# Patient Record
Sex: Male | Born: 1950 | Race: White | Hispanic: No | Marital: Married | Smoking: Former smoker
Health system: Southern US, Community
[De-identification: ages and names within clinical notes are randomized; demographics above are authoritative.]

## PROBLEM LIST (undated history)

## (undated) DIAGNOSIS — C801 Malignant (primary) neoplasm, unspecified: Secondary | ICD-10-CM

## (undated) HISTORY — PX: EYE MUSCLE SURGERY: SHX370

## (undated) HISTORY — PX: COLONOSCOPY: SHX174

## (undated) HISTORY — PX: LUNG REMOVAL, PARTIAL: SHX233

---

## 2007-03-14 ENCOUNTER — Ambulatory Visit (HOSPITAL_COMMUNITY): Admission: RE | Admit: 2007-03-14 | Discharge: 2007-03-14 | Payer: Self-pay | Admitting: Ophthalmology

## 2007-03-15 ENCOUNTER — Ambulatory Visit (HOSPITAL_COMMUNITY): Admission: RE | Admit: 2007-03-15 | Discharge: 2007-03-15 | Payer: Self-pay | Admitting: Ophthalmology

## 2007-03-20 ENCOUNTER — Ambulatory Visit: Payer: Self-pay | Admitting: Cardiothoracic Surgery

## 2007-03-25 ENCOUNTER — Ambulatory Visit: Admission: RE | Admit: 2007-03-25 | Discharge: 2007-03-25 | Payer: Self-pay | Admitting: Cardiothoracic Surgery

## 2007-03-25 ENCOUNTER — Ambulatory Visit (HOSPITAL_COMMUNITY): Admission: RE | Admit: 2007-03-25 | Discharge: 2007-03-25 | Payer: Self-pay | Admitting: Cardiothoracic Surgery

## 2007-04-03 ENCOUNTER — Ambulatory Visit: Payer: Self-pay | Admitting: Internal Medicine

## 2007-04-07 ENCOUNTER — Ambulatory Visit: Payer: Self-pay

## 2007-04-09 ENCOUNTER — Ambulatory Visit: Payer: Self-pay | Admitting: Cardiothoracic Surgery

## 2007-04-18 ENCOUNTER — Ambulatory Visit: Payer: Self-pay | Admitting: Cardiothoracic Surgery

## 2007-04-18 ENCOUNTER — Inpatient Hospital Stay (HOSPITAL_COMMUNITY): Admission: RE | Admit: 2007-04-18 | Discharge: 2007-04-27 | Payer: Self-pay | Admitting: Cardiothoracic Surgery

## 2007-04-18 ENCOUNTER — Encounter: Payer: Self-pay | Admitting: Cardiothoracic Surgery

## 2007-04-22 ENCOUNTER — Ambulatory Visit: Payer: Self-pay | Admitting: Internal Medicine

## 2007-04-23 ENCOUNTER — Ambulatory Visit: Payer: Self-pay | Admitting: Internal Medicine

## 2007-05-07 LAB — CBC WITH DIFFERENTIAL/PLATELET
BASO%: 0.7 % (ref 0.0–2.0)
Basophils Absolute: 0 10*3/uL (ref 0.0–0.1)
EOS%: 15.5 % — ABNORMAL HIGH (ref 0.0–7.0)
LYMPH%: 23.1 % (ref 14.0–48.0)
MONO%: 9.3 % (ref 0.0–13.0)
NEUT#: 3.5 10*3/uL (ref 1.5–6.5)
NEUT%: 51.4 % (ref 40.0–75.0)
Platelets: 425 10*3/uL — ABNORMAL HIGH (ref 145–400)
RBC: 4.07 10*6/uL — ABNORMAL LOW (ref 4.20–5.71)
WBC: 6.8 10*3/uL (ref 4.0–10.0)

## 2007-05-07 LAB — COMPREHENSIVE METABOLIC PANEL
ALT: 19 U/L (ref 0–53)
AST: 16 U/L (ref 0–37)
Alkaline Phosphatase: 59 U/L (ref 39–117)
BUN: 16 mg/dL (ref 6–23)
Calcium: 9.1 mg/dL (ref 8.4–10.5)
Creatinine, Ser: 1.04 mg/dL (ref 0.40–1.50)
Total Bilirubin: 0.3 mg/dL (ref 0.3–1.2)

## 2007-05-08 ENCOUNTER — Encounter: Admission: RE | Admit: 2007-05-08 | Discharge: 2007-05-08 | Payer: Self-pay | Admitting: Cardiothoracic Surgery

## 2007-05-08 ENCOUNTER — Ambulatory Visit: Payer: Self-pay | Admitting: Cardiothoracic Surgery

## 2007-05-21 LAB — CBC WITH DIFFERENTIAL/PLATELET
Basophils Absolute: 0 10*3/uL (ref 0.0–0.1)
EOS%: 8.5 % — ABNORMAL HIGH (ref 0.0–7.0)
HCT: 39.6 % (ref 38.7–49.9)
HGB: 14 g/dL (ref 13.0–17.1)
LYMPH%: 38.5 % (ref 14.0–48.0)
MCH: 29.9 pg (ref 28.0–33.4)
MCV: 84.4 fL (ref 81.6–98.0)
NEUT%: 42.9 % (ref 40.0–75.0)
Platelets: 273 10*3/uL (ref 145–400)
lymph#: 2.1 10*3/uL (ref 0.9–3.3)

## 2007-05-21 LAB — MAGNESIUM: Magnesium: 2.1 mg/dL (ref 1.5–2.5)

## 2007-05-21 LAB — COMPREHENSIVE METABOLIC PANEL
AST: 19 U/L (ref 0–37)
BUN: 15 mg/dL (ref 6–23)
Calcium: 9.4 mg/dL (ref 8.4–10.5)
Chloride: 104 mEq/L (ref 96–112)
Creatinine, Ser: 1.11 mg/dL (ref 0.40–1.50)
Total Bilirubin: 0.6 mg/dL (ref 0.3–1.2)

## 2007-05-29 LAB — COMPREHENSIVE METABOLIC PANEL
Albumin: 4.1 g/dL (ref 3.5–5.2)
BUN: 13 mg/dL (ref 6–23)
CO2: 26 mEq/L (ref 19–32)
Calcium: 8.5 mg/dL (ref 8.4–10.5)
Chloride: 100 mEq/L (ref 96–112)
Glucose, Bld: 127 mg/dL — ABNORMAL HIGH (ref 70–99)
Potassium: 4.3 mEq/L (ref 3.5–5.3)

## 2007-05-29 LAB — CBC WITH DIFFERENTIAL/PLATELET
Basophils Absolute: 0 10*3/uL (ref 0.0–0.1)
Eosinophils Absolute: 0.1 10*3/uL (ref 0.0–0.5)
HCT: 36.7 % — ABNORMAL LOW (ref 38.7–49.9)
HGB: 13.1 g/dL (ref 13.0–17.1)
MCV: 84.2 fL (ref 81.6–98.0)
NEUT#: 7 10*3/uL — ABNORMAL HIGH (ref 1.5–6.5)
RDW: 13.2 % (ref 11.2–14.6)
lymph#: 2.3 10*3/uL (ref 0.9–3.3)

## 2007-05-29 LAB — MAGNESIUM: Magnesium: 2 mg/dL (ref 1.5–2.5)

## 2007-06-03 ENCOUNTER — Ambulatory Visit: Payer: Self-pay | Admitting: Internal Medicine

## 2007-06-11 LAB — COMPREHENSIVE METABOLIC PANEL
BUN: 18 mg/dL (ref 6–23)
CO2: 25 mEq/L (ref 19–32)
Calcium: 8.9 mg/dL (ref 8.4–10.5)
Chloride: 104 mEq/L (ref 96–112)
Creatinine, Ser: 1.02 mg/dL (ref 0.40–1.50)
Total Bilirubin: 0.4 mg/dL (ref 0.3–1.2)

## 2007-06-11 LAB — CBC WITH DIFFERENTIAL/PLATELET
Basophils Absolute: 0 10*3/uL (ref 0.0–0.1)
EOS%: 0.4 % (ref 0.0–7.0)
HCT: 36.1 % — ABNORMAL LOW (ref 38.7–49.9)
HGB: 12.8 g/dL — ABNORMAL LOW (ref 13.0–17.1)
LYMPH%: 24.5 % (ref 14.0–48.0)
MCH: 29.9 pg (ref 28.0–33.4)
MCHC: 35.4 g/dL (ref 32.0–35.9)
MONO#: 0.3 10*3/uL (ref 0.1–0.9)
NEUT%: 69.7 % (ref 40.0–75.0)
Platelets: 475 10*3/uL — ABNORMAL HIGH (ref 145–400)
lymph#: 1.6 10*3/uL (ref 0.9–3.3)

## 2007-06-11 LAB — MAGNESIUM: Magnesium: 2.1 mg/dL (ref 1.5–2.5)

## 2007-07-21 ENCOUNTER — Ambulatory Visit: Payer: Self-pay | Admitting: Internal Medicine

## 2007-07-23 LAB — COMPREHENSIVE METABOLIC PANEL
Alkaline Phosphatase: 64 U/L (ref 39–117)
BUN: 14 mg/dL (ref 6–23)
Creatinine, Ser: 1.04 mg/dL (ref 0.40–1.50)
Glucose, Bld: 90 mg/dL (ref 70–99)
Total Bilirubin: 0.4 mg/dL (ref 0.3–1.2)

## 2007-07-23 LAB — CBC WITH DIFFERENTIAL/PLATELET
Basophils Absolute: 0 10*3/uL (ref 0.0–0.1)
Eosinophils Absolute: 0 10*3/uL (ref 0.0–0.5)
HCT: 35 % — ABNORMAL LOW (ref 38.7–49.9)
HGB: 12.5 g/dL — ABNORMAL LOW (ref 13.0–17.1)
MONO#: 0.6 10*3/uL (ref 0.1–0.9)
NEUT#: 2.9 10*3/uL (ref 1.5–6.5)
RDW: 17 % — ABNORMAL HIGH (ref 11.2–14.6)
lymph#: 1.7 10*3/uL (ref 0.9–3.3)

## 2007-08-22 ENCOUNTER — Ambulatory Visit (HOSPITAL_COMMUNITY): Admission: RE | Admit: 2007-08-22 | Discharge: 2007-08-22 | Payer: Self-pay | Admitting: Internal Medicine

## 2007-08-25 LAB — CBC WITH DIFFERENTIAL/PLATELET
Eosinophils Absolute: 0.1 10*3/uL (ref 0.0–0.5)
HCT: 40.1 % (ref 38.7–49.9)
LYMPH%: 37.1 % (ref 14.0–48.0)
MONO#: 0.5 10*3/uL (ref 0.1–0.9)
NEUT#: 2.9 10*3/uL (ref 1.5–6.5)
NEUT%: 51.1 % (ref 40.0–75.0)
Platelets: 250 10*3/uL (ref 145–400)
WBC: 5.6 10*3/uL (ref 4.0–10.0)

## 2007-08-25 LAB — COMPREHENSIVE METABOLIC PANEL
CO2: 25 mEq/L (ref 19–32)
Calcium: 9.4 mg/dL (ref 8.4–10.5)
Chloride: 104 mEq/L (ref 96–112)
Creatinine, Ser: 1.27 mg/dL (ref 0.40–1.50)
Glucose, Bld: 146 mg/dL — ABNORMAL HIGH (ref 70–99)
Sodium: 139 mEq/L (ref 135–145)
Total Bilirubin: 0.5 mg/dL (ref 0.3–1.2)
Total Protein: 6.9 g/dL (ref 6.0–8.3)

## 2007-08-25 LAB — MAGNESIUM: Magnesium: 2.1 mg/dL (ref 1.5–2.5)

## 2007-09-12 ENCOUNTER — Ambulatory Visit (HOSPITAL_COMMUNITY): Admission: RE | Admit: 2007-09-12 | Discharge: 2007-09-12 | Payer: Self-pay | Admitting: Ophthalmology

## 2007-10-16 ENCOUNTER — Ambulatory Visit: Payer: Self-pay | Admitting: Cardiothoracic Surgery

## 2007-10-16 ENCOUNTER — Encounter: Admission: RE | Admit: 2007-10-16 | Discharge: 2007-10-16 | Payer: Self-pay | Admitting: Cardiothoracic Surgery

## 2007-10-23 ENCOUNTER — Ambulatory Visit: Payer: Self-pay | Admitting: Internal Medicine

## 2007-10-27 LAB — CBC WITH DIFFERENTIAL/PLATELET
BASO%: 0.8 % (ref 0.0–2.0)
EOS%: 3.7 % (ref 0.0–7.0)
HCT: 40.9 % (ref 38.7–49.9)
MCHC: 34.9 g/dL (ref 32.0–35.9)
MONO#: 0.6 10*3/uL (ref 0.1–0.9)
NEUT%: 47.4 % (ref 40.0–75.0)
RDW: 12.4 % (ref 11.2–14.6)
WBC: 5.3 10*3/uL (ref 4.0–10.0)
lymph#: 2 10*3/uL (ref 0.9–3.3)

## 2007-10-27 LAB — COMPREHENSIVE METABOLIC PANEL
ALT: 13 U/L (ref 0–53)
AST: 21 U/L (ref 0–37)
Calcium: 9.7 mg/dL (ref 8.4–10.5)
Chloride: 104 mEq/L (ref 96–112)
Creatinine, Ser: 1.2 mg/dL (ref 0.40–1.50)
Potassium: 4.5 mEq/L (ref 3.5–5.3)
Sodium: 140 mEq/L (ref 135–145)

## 2007-11-05 ENCOUNTER — Ambulatory Visit (HOSPITAL_COMMUNITY): Admission: RE | Admit: 2007-11-05 | Discharge: 2007-11-05 | Payer: Self-pay | Admitting: Internal Medicine

## 2008-03-17 IMAGING — CR DG CHEST 1V PORT
1 series · 1 of 1 positions shown · non-contrast
Comparison: Comparison is made with yesterday?s exam.

CLINICAL DATA: Follow-up VATS.
 PORTABLE CHEST - 1 VIEW ? 04/25/07 AT 6288 HOURS:

[view not recorded]
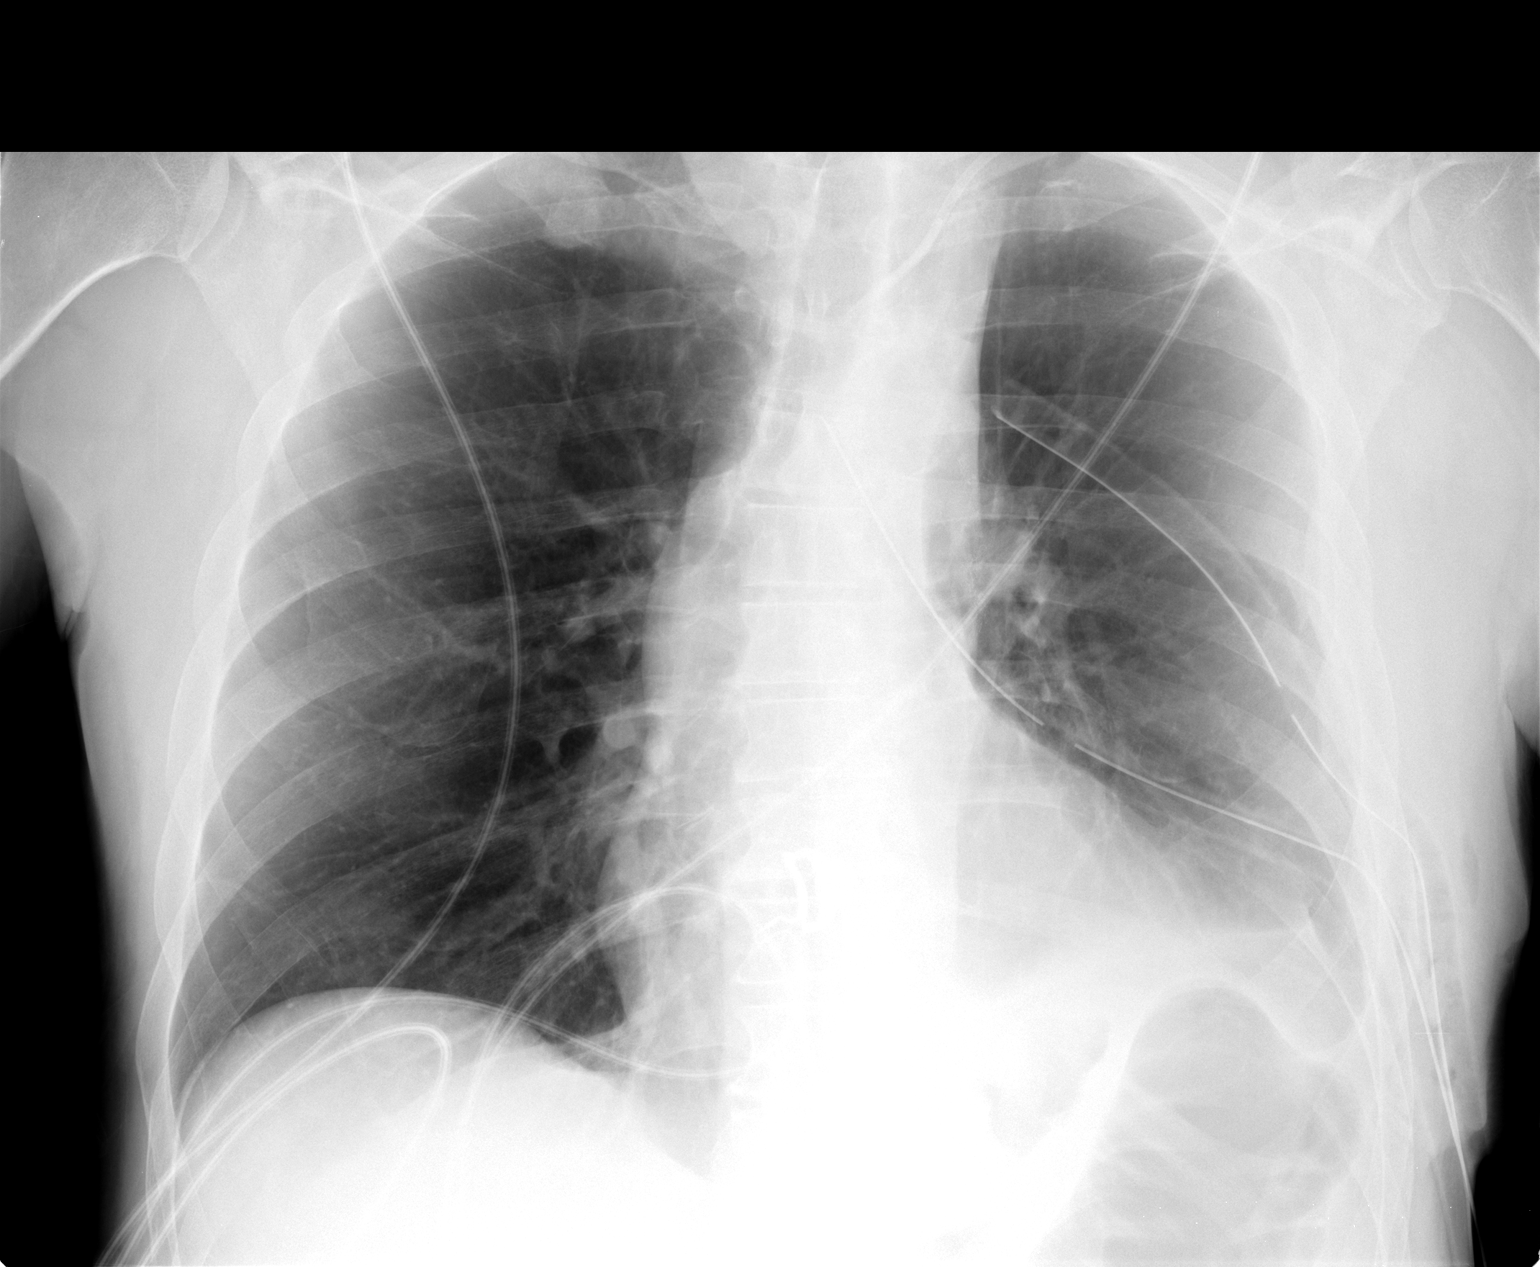

[1 of 1 positions shown; findings below may reference images not displayed]

FINDINGS: Left subclavian CVC is in the distal aspect of the left innominate vein. Two left pleural chest tubes. No pneumothorax. Mild pleural thickening/fluid lateral aspect of left lower hemithorax in the lateral costophrenic angle.
IMPRESSION: No significant interval change. Postop findings on the left. No pneumothorax.

## 2008-03-18 IMAGING — CR DG CHEST 1V PORT
1 series · 1 of 1 positions shown · non-contrast
Comparison: 04/25/07 and 04/24/07.

CLINICAL DATA: 56-year-old male with lung abscess. Status post thoracotomy.
PORTABLE CHEST ? 1 VIEW:

[view not recorded]
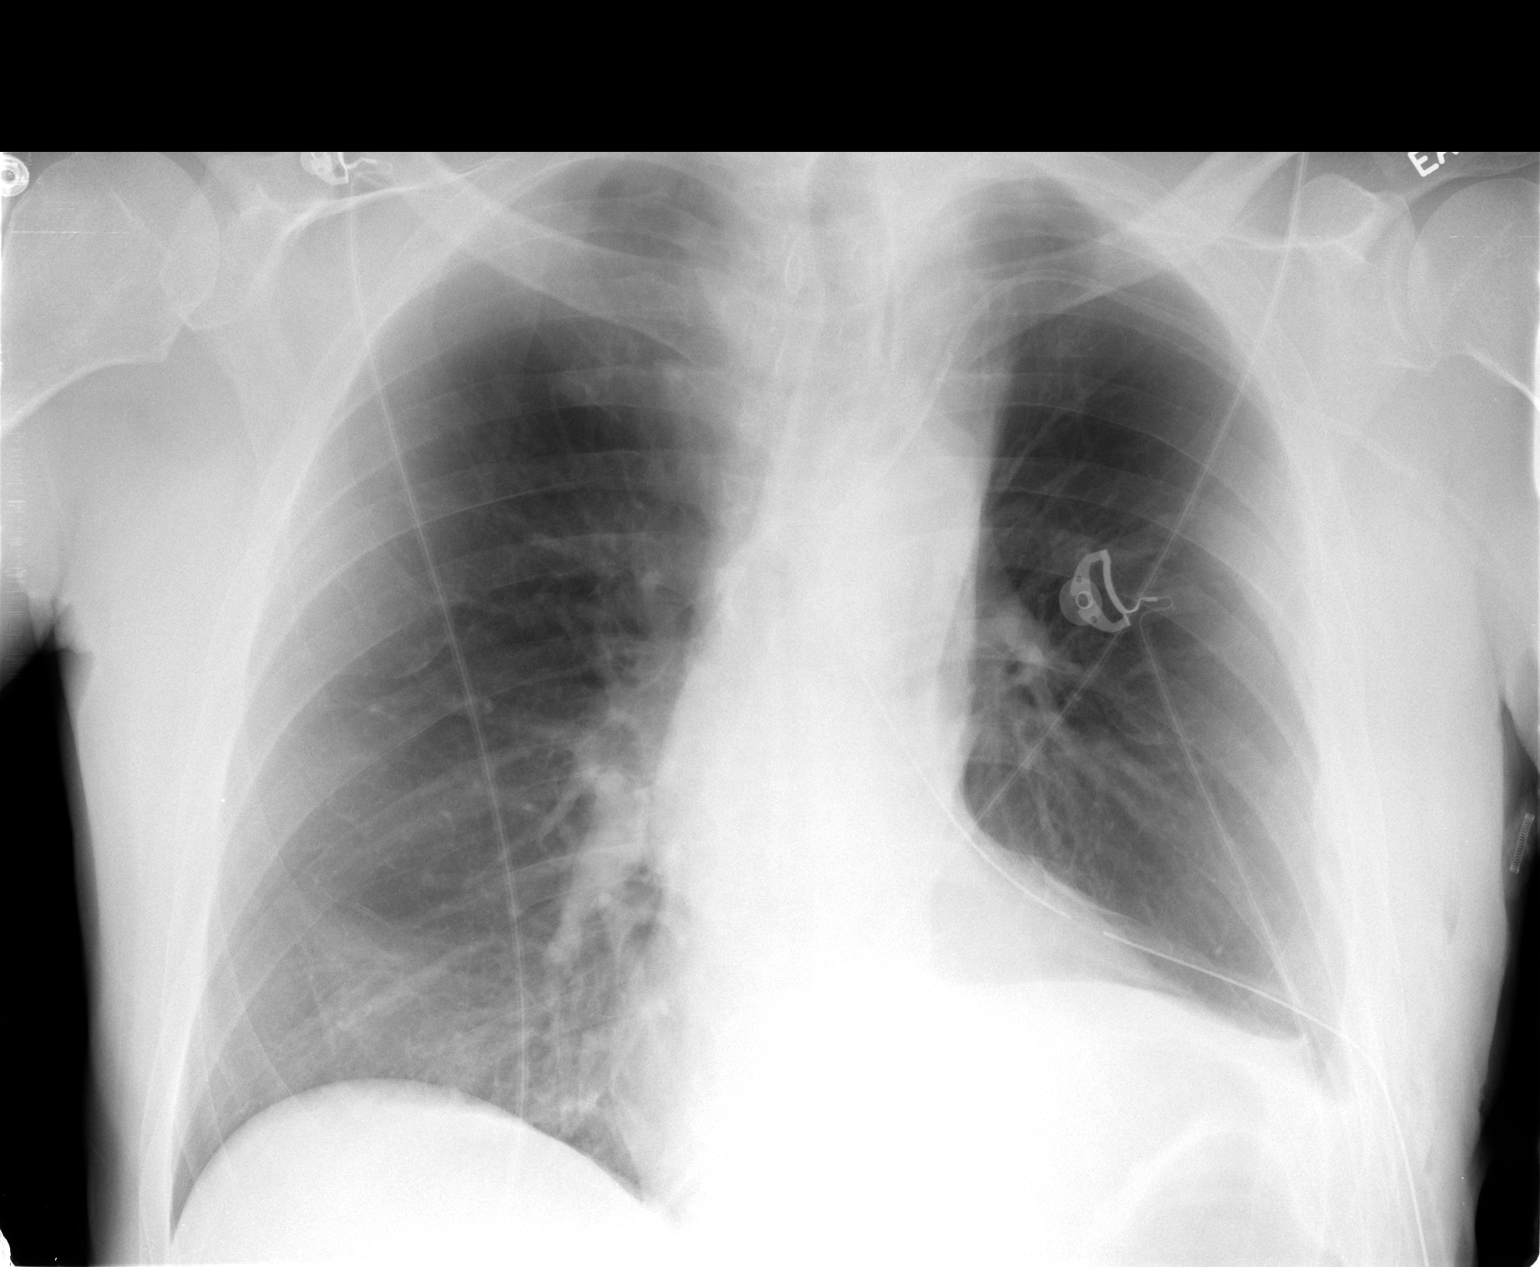

[1 of 1 positions shown; findings below may reference images not displayed]

FINDINGS: One left chest tube remains.  Left subclavian central line tip is in the left innominate vein.  Right lung volume and aeration is stable.  Left lower lobe atelectasis and residual effusion persist.  Aeration is stable.
IMPRESSION: Removal of one left chest tube.  No pneumothorax.  Overall stable exam.

## 2008-03-19 IMAGING — CR DG CHEST 2V
2 series · 2 of 2 positions shown · non-contrast
Comparison: 04/27/2007

CLINICAL DATA: 56-year-old male, pneumothorax status post left thoracotomy.  Chest tube removal.
CHEST - 2 VIEW:

[w chest pa]
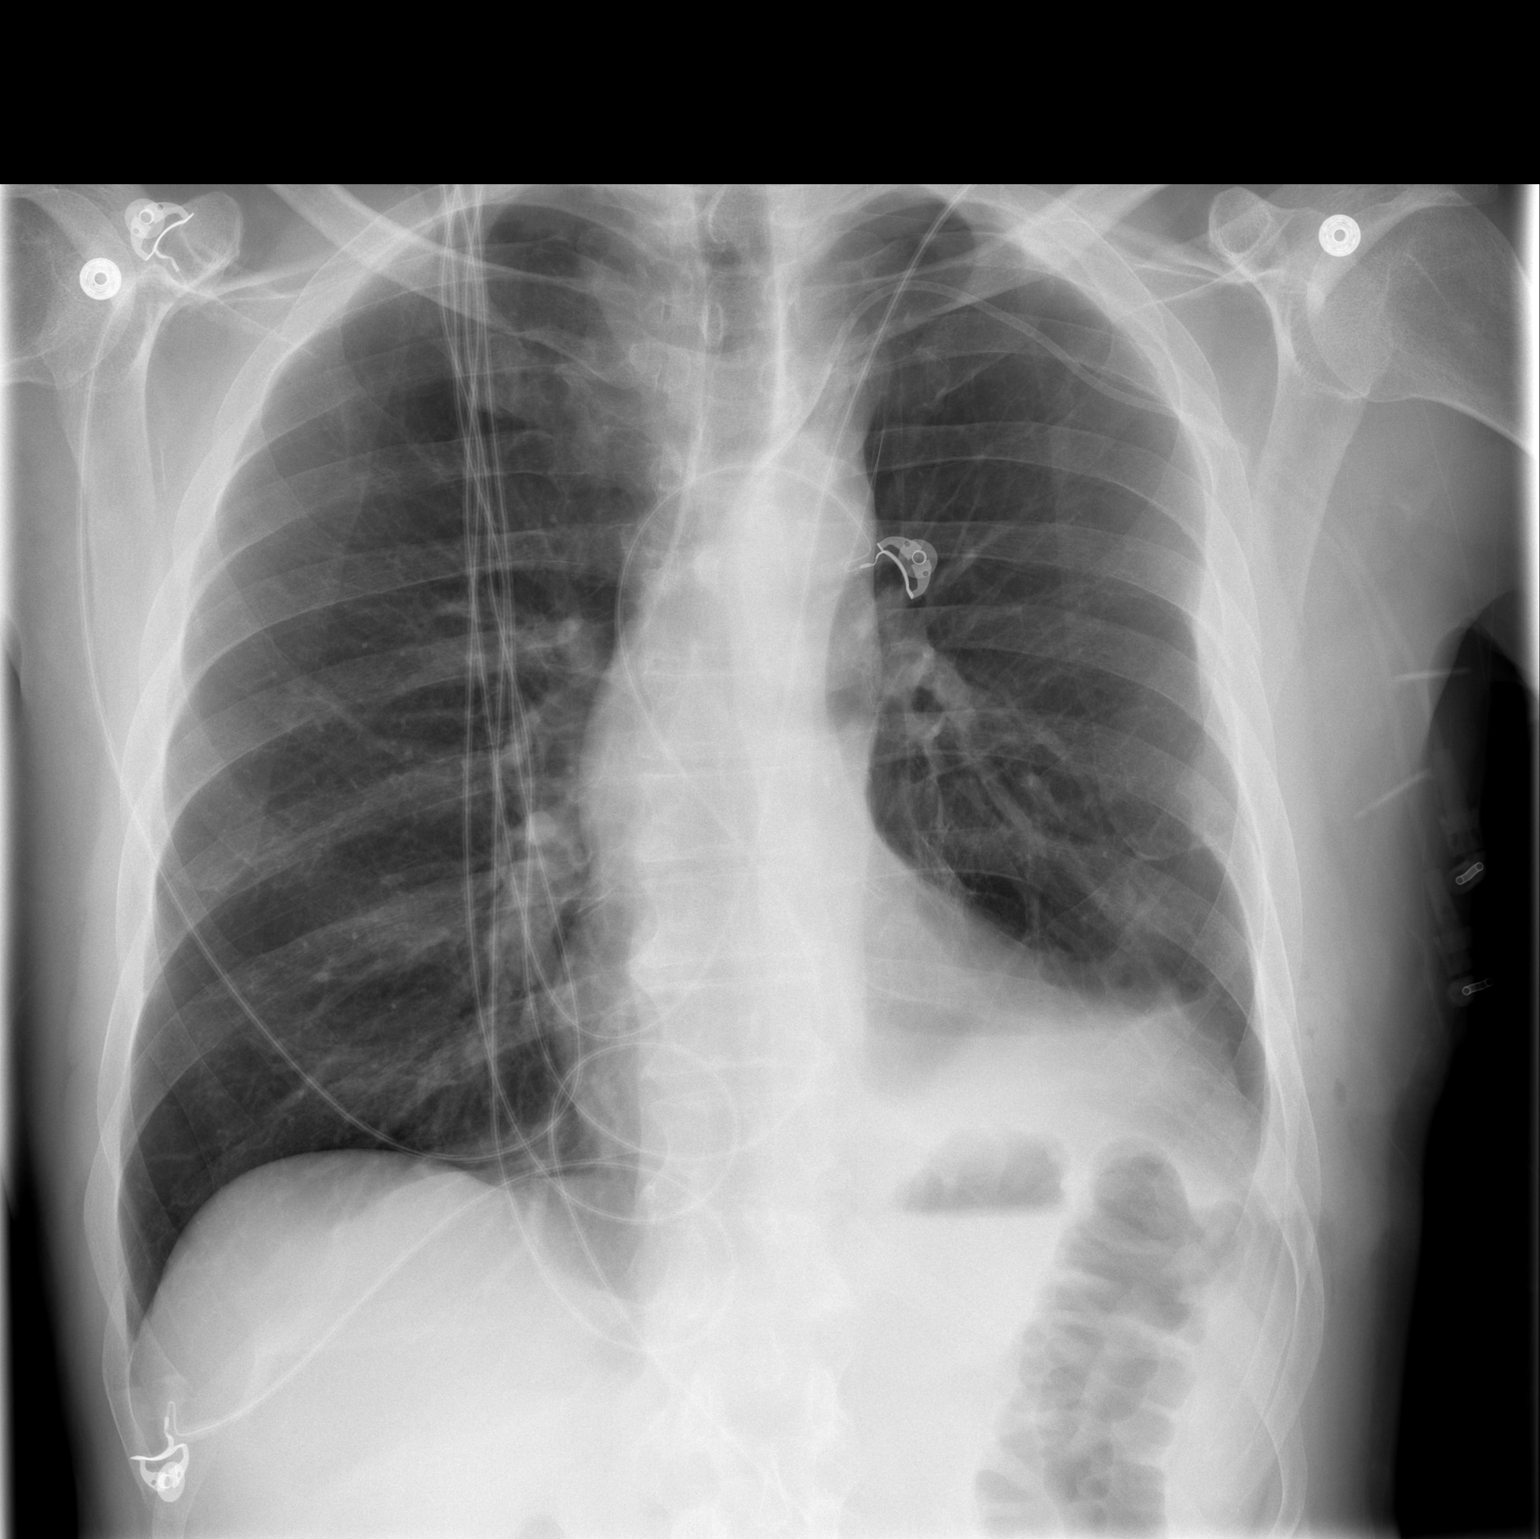

[w chest lat]
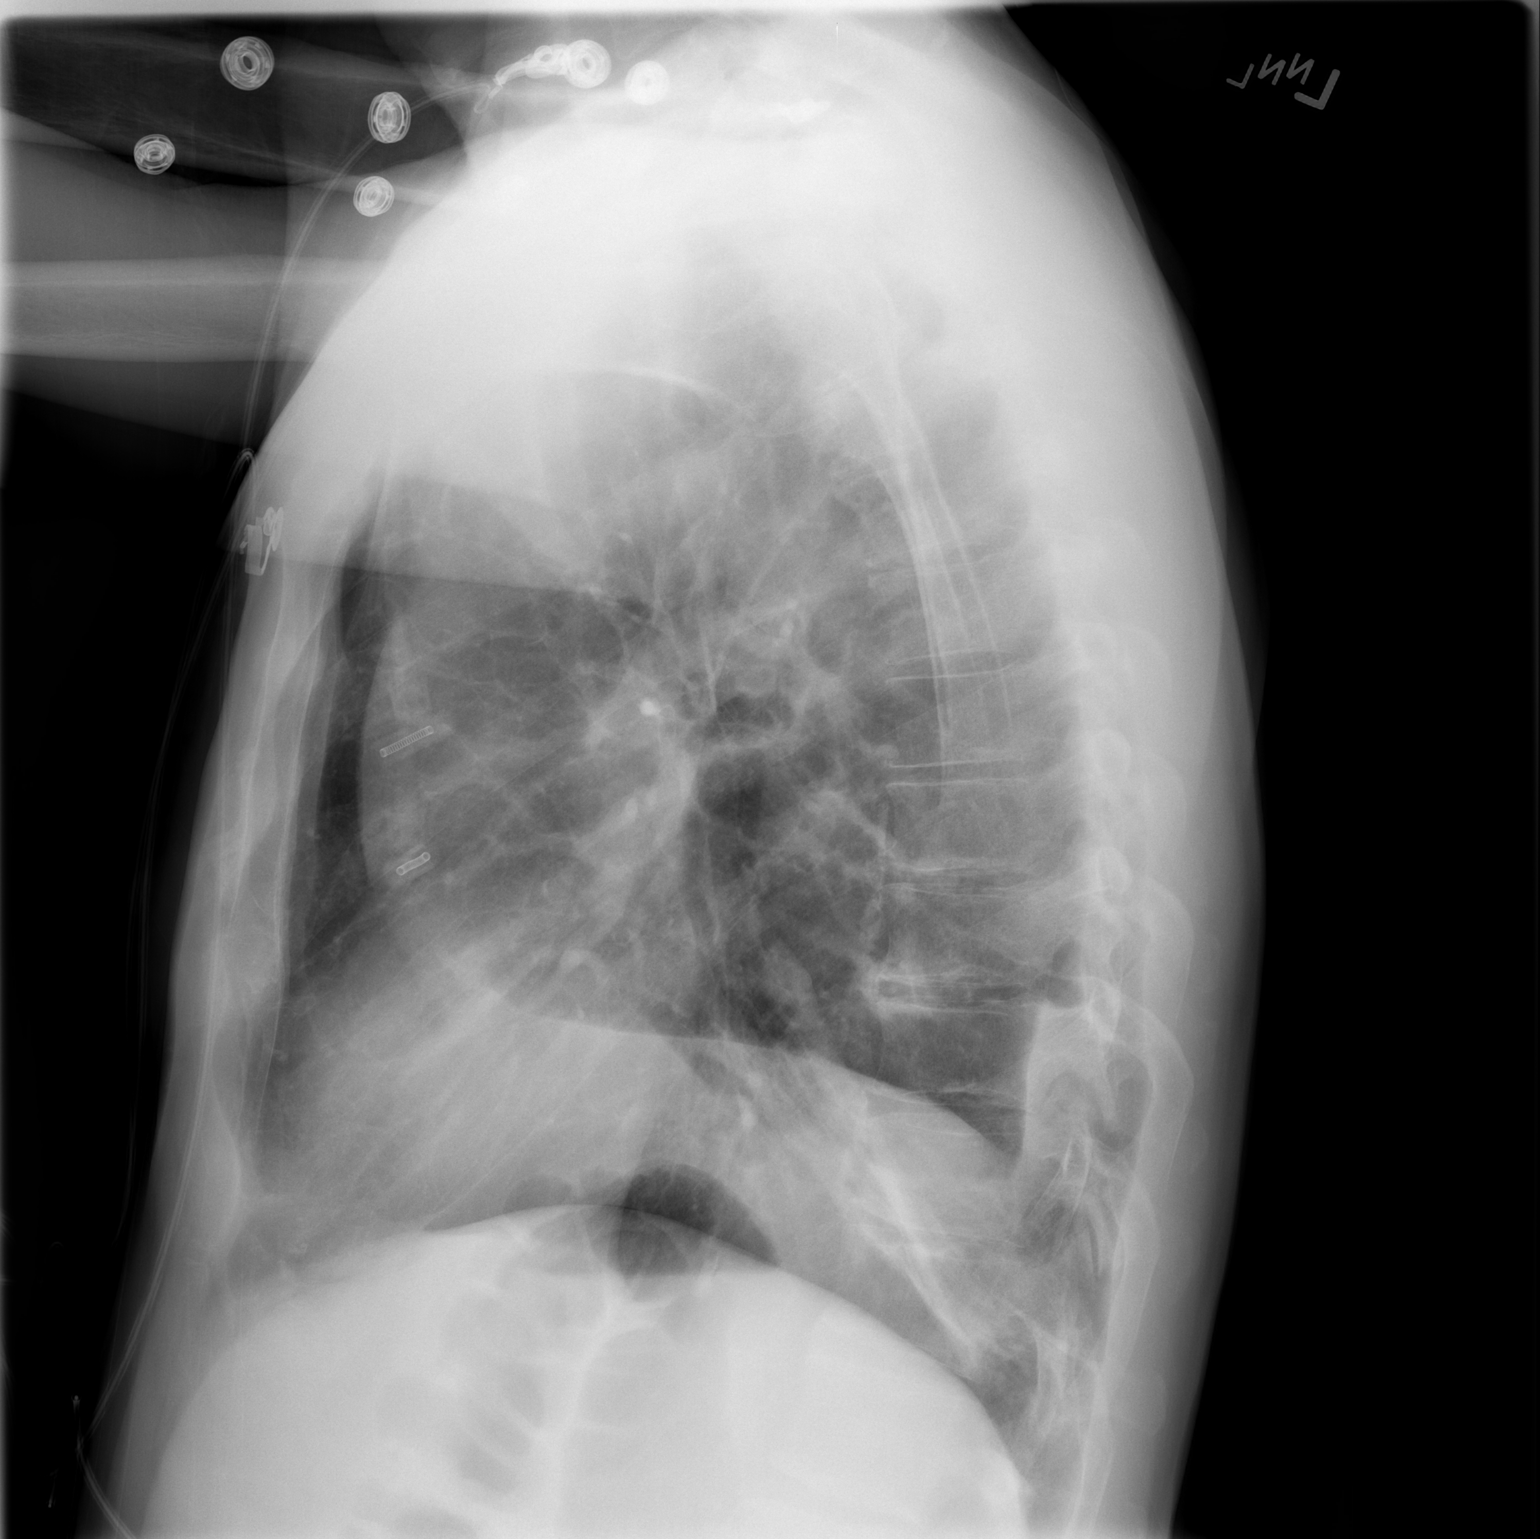

[2 of 2 positions shown; findings below may reference images not displayed]

FINDINGS: Left chest tube has been removed.  There is a stable residual left apical medial pneumothorax.  No interval change.  Left central line tip is in the left innominate vein.  Left lower lobe atelectasis and effusion persist.
IMPRESSION: 1.  Stable tiny residual left apical pneumothorax and pleural effusion.
2.  Left lower lobe atelectasis.

## 2008-04-30 ENCOUNTER — Ambulatory Visit: Payer: Self-pay | Admitting: Internal Medicine

## 2008-05-04 LAB — COMPREHENSIVE METABOLIC PANEL
Albumin: 4.6 g/dL (ref 3.5–5.2)
CO2: 24 mEq/L (ref 19–32)
Glucose, Bld: 87 mg/dL (ref 70–99)
Sodium: 140 mEq/L (ref 135–145)
Total Bilirubin: 0.5 mg/dL (ref 0.3–1.2)
Total Protein: 7.2 g/dL (ref 6.0–8.3)

## 2008-05-04 LAB — CBC WITH DIFFERENTIAL/PLATELET
Eosinophils Absolute: 0 10*3/uL (ref 0.0–0.5)
HCT: 39.9 % (ref 38.7–49.9)
HGB: 14.1 g/dL (ref 13.0–17.1)
LYMPH%: 42.5 % (ref 14.0–48.0)
MONO#: 0.4 10*3/uL (ref 0.1–0.9)
NEUT#: 2 10*3/uL (ref 1.5–6.5)
NEUT%: 46.7 % (ref 40.0–75.0)
Platelets: 298 10*3/uL (ref 145–400)
RBC: 4.67 10*6/uL (ref 4.20–5.71)
WBC: 4.4 10*3/uL (ref 4.0–10.0)

## 2008-05-06 ENCOUNTER — Ambulatory Visit (HOSPITAL_COMMUNITY): Admission: RE | Admit: 2008-05-06 | Discharge: 2008-05-06 | Payer: Self-pay | Admitting: Internal Medicine

## 2008-07-14 IMAGING — CT CT CHEST W/ CM
2 of 4 series · 15 of 36 positions shown, 18 images · IV contrast (omnipaque)
Comparison: 03/15/07.

CLINICAL DATA: Lung cancer diagnosed in February 2007. Status post left upper lobectomy and chemotherapy.
CHEST CT WITH CONTRAST:
TECHNIQUE: Multidetector CT imaging of the chest was performed following the standard protocol during bolus administration of intravenous contrast.
Contrast:  80 cc Omnipaque 300

[Series 2: chest routine 5.0 b40f · axial · 0.74mm/px · z∈[-438,-88]mm · 12 of 82 slices shown, 15 images]
[im 6/82  mediastinal]
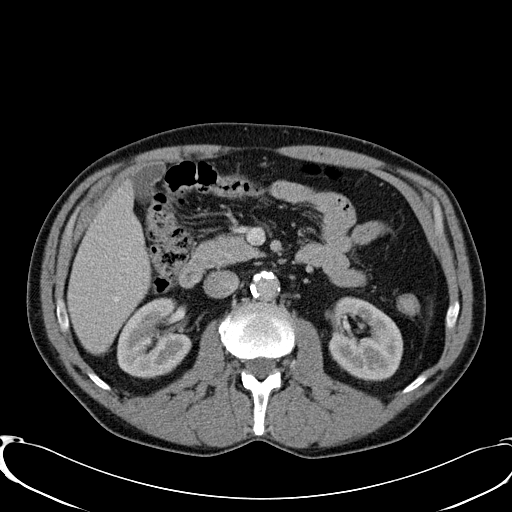
[im 6/82  lung]
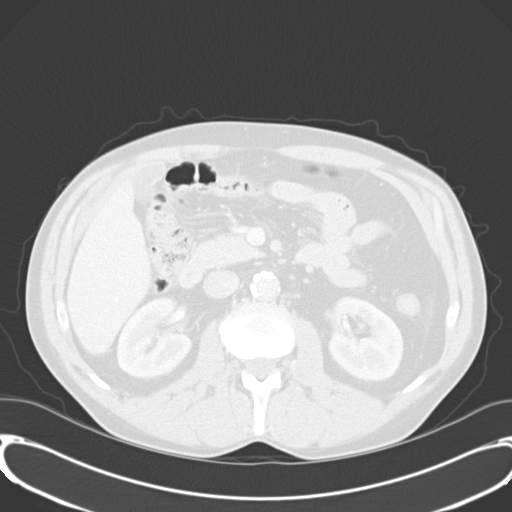
[im 11/82  lung]
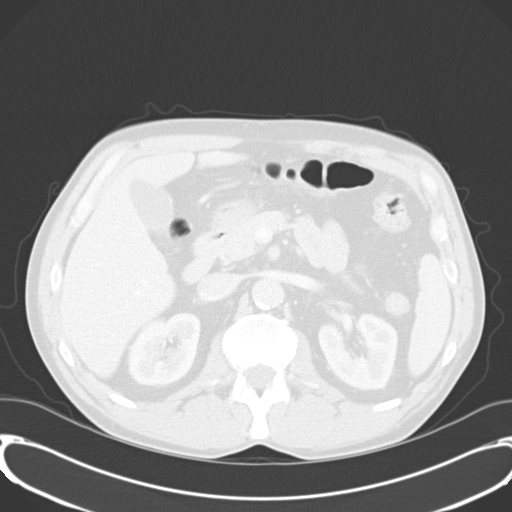
[im 17/82  lung]
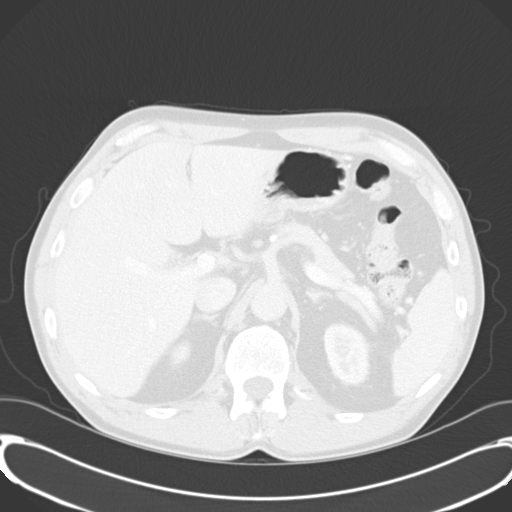
[im 28/82  lung]
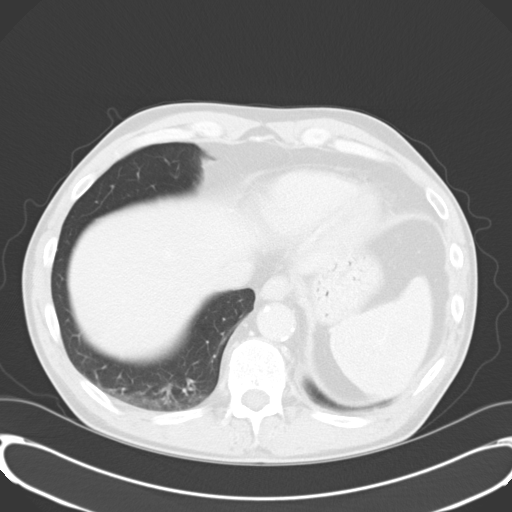
[im 33/82  mediastinal]
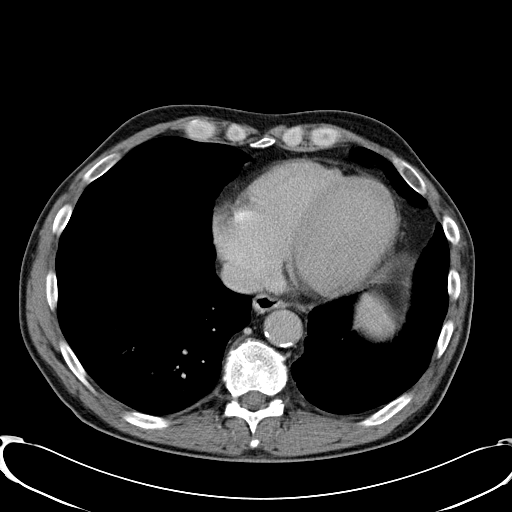
[im 33/82  lung]
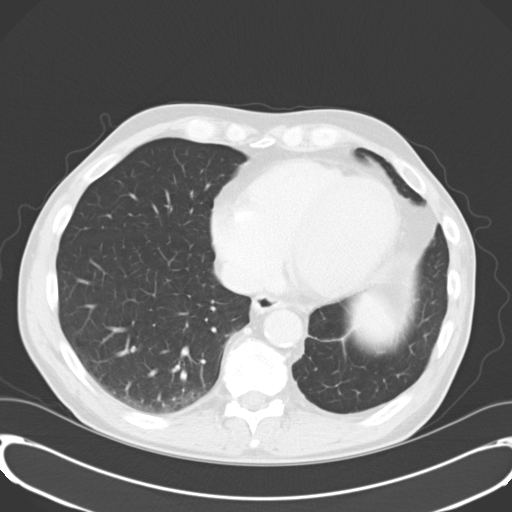
[im 38/82  lung]
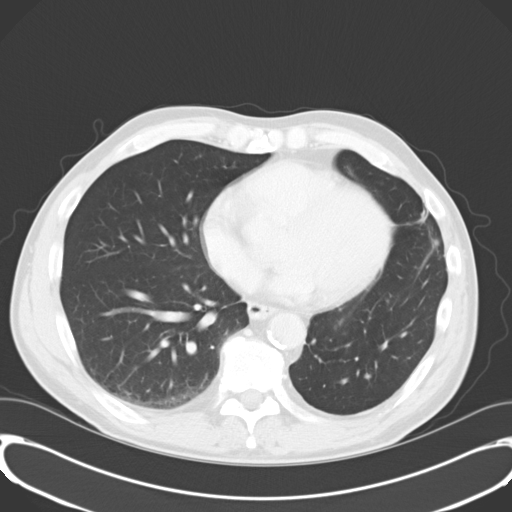
[im 44/82  lung]
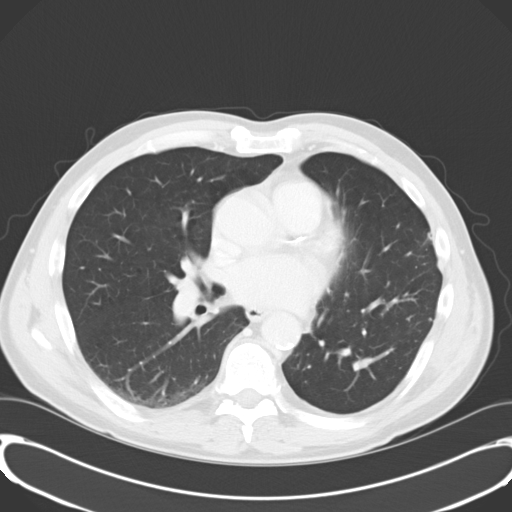
[im 49/82  lung]
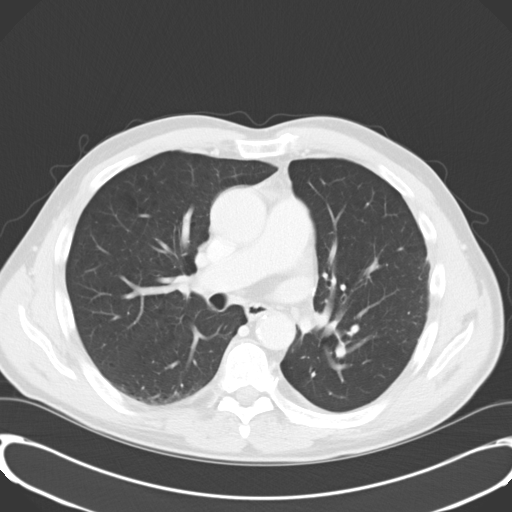
[im 55/82  mediastinal]
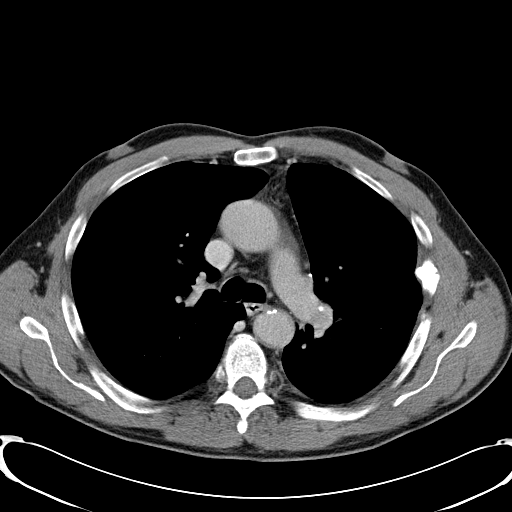
[im 55/82  lung]
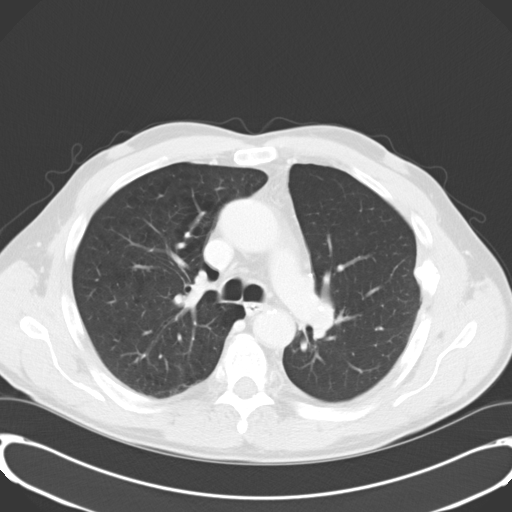
[im 65/82  lung]
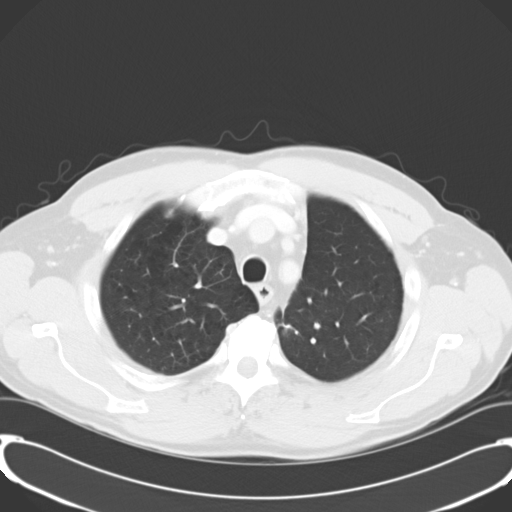
[im 71/82  lung]
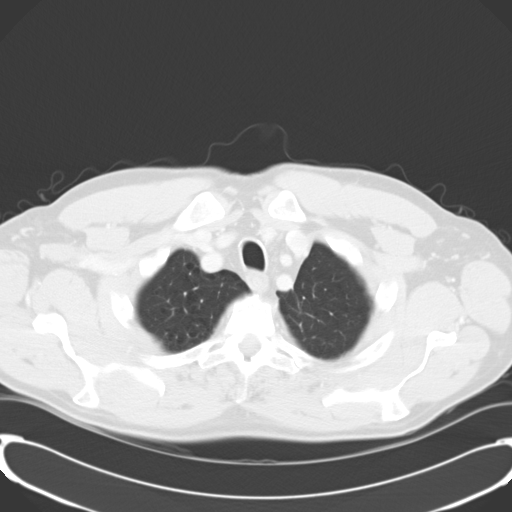
[im 76/82  lung]
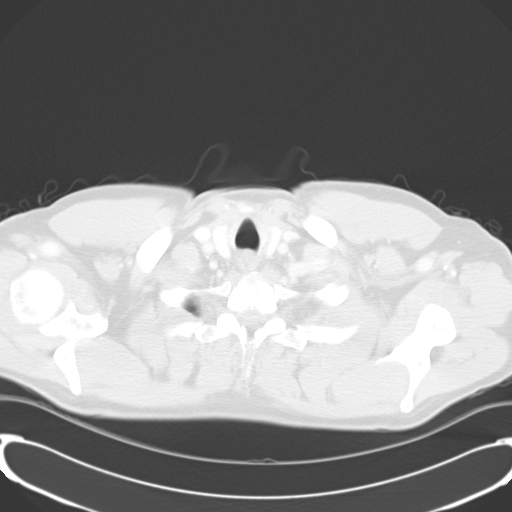

[Series 602: <mpr thick range> · coronal · 0.80mm/px · 3 of 83 slices shown]
[im 17/83  lung]
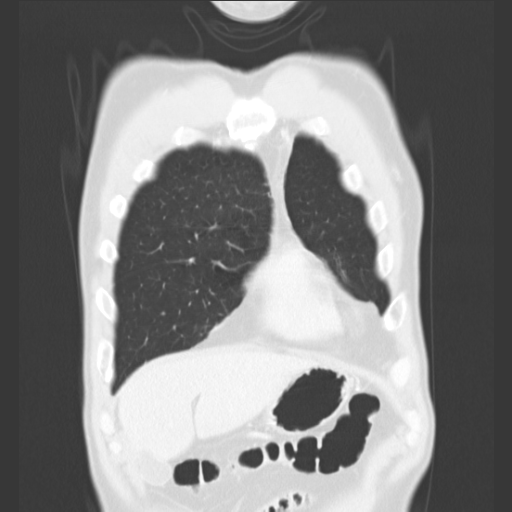
[im 33/83  lung]
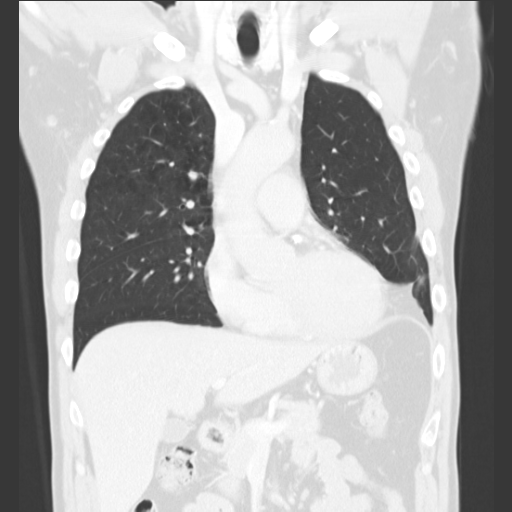
[im 50/83  lung]
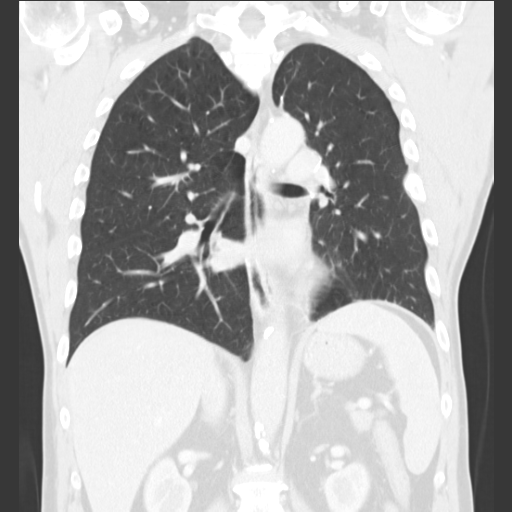

[15 of 36 positions shown; findings below may reference images not displayed]

FINDINGS: Since the prior exam, the patient has undergone left upper lobectomy.  There is a staple line at the stump of the left upper lobe bronchus.  There is a 12 x 8 mm area of soft tissue at the stump adjacent to the staple line.  This probably represents scar tissue but should be monitored on follow-up scans to determine ongoing stability and to exclude a recurrence at the stump. There is no definable adenopathy in the chest. Heart size is normal. The lungs are clear of infiltrates and effusions.  There are some emphysematous changes in both lungs, primarily in the right upper lobe.  No significant bony abnormality. The visualized portion of the upper abdomen demonstrates no acute abnormality.  There is fairly extensive calcification in the abdominal aorta.
IMPRESSION: 1.  Small area of soft tissue density at the stump of the left upper lobe bronchus.  This probably represents scar tissue but this area should be monitored to exclude local recurrence at the stump.
2.  No other significant abnormality.

## 2008-09-02 ENCOUNTER — Ambulatory Visit (HOSPITAL_COMMUNITY): Admission: RE | Admit: 2008-09-02 | Discharge: 2008-09-02 | Payer: Self-pay | Admitting: Internal Medicine

## 2008-11-02 ENCOUNTER — Ambulatory Visit: Payer: Self-pay | Admitting: Internal Medicine

## 2008-11-04 ENCOUNTER — Ambulatory Visit (HOSPITAL_COMMUNITY): Admission: RE | Admit: 2008-11-04 | Discharge: 2008-11-04 | Payer: Self-pay | Admitting: Internal Medicine

## 2008-11-04 LAB — CBC WITH DIFFERENTIAL/PLATELET
Eosinophils Absolute: 0.1 10*3/uL (ref 0.0–0.5)
HCT: 38.9 % (ref 38.7–49.9)
LYMPH%: 41.1 % (ref 14.0–48.0)
MCV: 88.7 fL (ref 81.6–98.0)
MONO#: 0.4 10*3/uL (ref 0.1–0.9)
NEUT#: 2 10*3/uL (ref 1.5–6.5)
NEUT%: 46.4 % (ref 40.0–75.0)
Platelets: 247 10*3/uL (ref 145–400)
WBC: 4.4 10*3/uL (ref 4.0–10.0)

## 2008-11-04 LAB — COMPREHENSIVE METABOLIC PANEL
BUN: 15 mg/dL (ref 6–23)
CO2: 28 mEq/L (ref 19–32)
Creatinine, Ser: 1.1 mg/dL (ref 0.40–1.50)
Glucose, Bld: 102 mg/dL — ABNORMAL HIGH (ref 70–99)
Total Bilirubin: 0.7 mg/dL (ref 0.3–1.2)

## 2009-05-13 ENCOUNTER — Ambulatory Visit: Payer: Self-pay | Admitting: Internal Medicine

## 2009-05-17 ENCOUNTER — Ambulatory Visit (HOSPITAL_COMMUNITY): Admission: RE | Admit: 2009-05-17 | Discharge: 2009-05-17 | Payer: Self-pay | Admitting: Internal Medicine

## 2009-05-17 LAB — CBC WITH DIFFERENTIAL/PLATELET
BASO%: 0.5 % (ref 0.0–2.0)
HCT: 39.8 % (ref 38.4–49.9)
LYMPH%: 35.5 % (ref 14.0–49.0)
MCH: 30.3 pg (ref 27.2–33.4)
MCHC: 34.4 g/dL (ref 32.0–36.0)
MCV: 88.1 fL (ref 79.3–98.0)
MONO#: 0.5 10*3/uL (ref 0.1–0.9)
MONO%: 10.1 % (ref 0.0–14.0)
NEUT%: 49.7 % (ref 39.0–75.0)
Platelets: 313 10*3/uL (ref 140–400)
RBC: 4.51 10*6/uL (ref 4.20–5.82)
WBC: 4.7 10*3/uL (ref 4.0–10.3)

## 2009-05-17 LAB — COMPREHENSIVE METABOLIC PANEL
ALT: 18 U/L (ref 0–53)
Alkaline Phosphatase: 52 U/L (ref 39–117)
CO2: 27 mEq/L (ref 19–32)
Creatinine, Ser: 1.11 mg/dL (ref 0.40–1.50)
Glucose, Bld: 87 mg/dL (ref 70–99)
Total Bilirubin: 0.5 mg/dL (ref 0.3–1.2)

## 2009-11-17 ENCOUNTER — Ambulatory Visit: Payer: Self-pay | Admitting: Internal Medicine

## 2009-11-21 ENCOUNTER — Ambulatory Visit (HOSPITAL_COMMUNITY): Admission: RE | Admit: 2009-11-21 | Discharge: 2009-11-21 | Payer: Self-pay | Admitting: Internal Medicine

## 2009-11-21 LAB — COMPREHENSIVE METABOLIC PANEL
ALT: 28 U/L (ref 0–53)
AST: 42 U/L — ABNORMAL HIGH (ref 0–37)
CO2: 27 mEq/L (ref 19–32)
Calcium: 8.7 mg/dL (ref 8.4–10.5)
Chloride: 106 mEq/L (ref 96–112)
Potassium: 4.2 mEq/L (ref 3.5–5.3)
Sodium: 138 mEq/L (ref 135–145)
Total Protein: 6.8 g/dL (ref 6.0–8.3)

## 2009-11-21 LAB — CBC WITH DIFFERENTIAL/PLATELET
BASO%: 0.6 % (ref 0.0–2.0)
EOS%: 1.1 % (ref 0.0–7.0)
HCT: 40.9 % (ref 38.4–49.9)
MCHC: 34 g/dL (ref 32.0–36.0)
MONO#: 0.5 10*3/uL (ref 0.1–0.9)
NEUT%: 40.6 % (ref 39.0–75.0)
RBC: 4.6 10*6/uL (ref 4.20–5.82)
RDW: 12.6 % (ref 11.0–14.6)
WBC: 4.8 10*3/uL (ref 4.0–10.3)
lymph#: 2.2 10*3/uL (ref 0.9–3.3)

## 2010-05-16 ENCOUNTER — Ambulatory Visit: Payer: Self-pay | Admitting: Internal Medicine

## 2010-05-18 ENCOUNTER — Ambulatory Visit (HOSPITAL_COMMUNITY): Admission: RE | Admit: 2010-05-18 | Discharge: 2010-05-18 | Payer: Self-pay | Admitting: Internal Medicine

## 2010-05-18 LAB — CBC WITH DIFFERENTIAL/PLATELET
BASO%: 0.5 % (ref 0.0–2.0)
EOS%: 2.9 % (ref 0.0–7.0)
LYMPH%: 43.3 % (ref 14.0–49.0)
MCH: 30.9 pg (ref 27.2–33.4)
MCHC: 34 g/dL (ref 32.0–36.0)
MONO#: 0.5 10*3/uL (ref 0.1–0.9)
NEUT%: 43.5 % (ref 39.0–75.0)
RBC: 4.52 10*6/uL (ref 4.20–5.82)
WBC: 4.6 10*3/uL (ref 4.0–10.3)
lymph#: 2 10*3/uL (ref 0.9–3.3)

## 2010-05-18 LAB — COMPREHENSIVE METABOLIC PANEL
ALT: 21 U/L (ref 0–53)
AST: 33 U/L (ref 0–37)
CO2: 28 mEq/L (ref 19–32)
Chloride: 105 mEq/L (ref 96–112)
Creatinine, Ser: 1.12 mg/dL (ref 0.40–1.50)
Sodium: 139 mEq/L (ref 135–145)
Total Bilirubin: 0.8 mg/dL (ref 0.3–1.2)
Total Protein: 6.6 g/dL (ref 6.0–8.3)

## 2010-11-25 ENCOUNTER — Other Ambulatory Visit: Payer: Self-pay | Admitting: Internal Medicine

## 2010-11-25 DIAGNOSIS — C3490 Malignant neoplasm of unspecified part of unspecified bronchus or lung: Secondary | ICD-10-CM

## 2010-11-27 ENCOUNTER — Encounter: Payer: Self-pay | Admitting: Orthopedic Surgery

## 2011-03-20 NOTE — Op Note (Signed)
NAMEGRIFFEN, Jerome Price NO.:  [id number]   MEDICAL RECORD NO.:  [id number]          PATIENT TYPE:  INP   LOCATION:  3316                         FACILITY:  [facility]   PHYSICIAN:  Sheliah Plane, MD    DATE OF BIRTH:  09/21/1951   DATE OF PROCEDURE:  04/18/2007  DATE OF DISCHARGE:                               OPERATIVE REPORT   PREOPERATIVE DIAGNOSIS:  Left upper lobe lung mass, probable carcinoma  of the lung.   POSTOPERATIVE DIAGNOSIS:  Left upper lobe lung mass, probable carcinoma  of the lung, adenocarcinoma.   PROCEDURE PERFORMED:  1. Bronchoscopy.  2. Left mini thoracotomy with left upper lobectomy and lymph node      dissection.  3. Placement of On-Q pain medication device.   SURGEON:  Sheliah Plane, M.D.   ASSISTANT:  Coral Ceo, P.A.   BRIEF HISTORY:  The patient is a 59 year old male who presented for eye  surgery.  His preop chest x-ray showed a left upper lobe lung mass.  The  CT  scan and PET scan were performed that showed no evidence of  metastatic disease.  He did have a calcification of his coronary  arteries.  Cardiac evaluation prior to thoracotomy was carried out and  the patient had a low risk Cardiolite stress test.  Pulmonary function  tests were adequate for lobectomy.  The risks and options of surgery and  treatment of presumed lung cancer were discussed with the patient who  agreed and signed informed consent.   DESCRIPTION OF PROCEDURE:  With central line and arterial line placed,  the patient underwent general endotracheal anesthesia with the double-  lumen endotracheal tube.  Through this tube, bronchoscope was passed  both down the left and right mainstem bronchus.  There was no evidence  of endobronchial lesions.  The tube was in good position.  Scope was  removed.  The patient was turned in the lateral decubitus position with  the left side up.  Initially a small incision made in the mid axillary  line, approximately  fourth to fifth intercostal space, and a 30-degree  videoscope was introduced into the chest.  The chest was explored and  there were fibrinous adhesions in the vicinity of the tumor to the chest  wall, though it did not appear to have gross tumor invading the chest  wall.  Using the __________ scope,  the left upper lobe was dissected  from the chest wall.  Biopsies of chest wall were performed to confirm  the lack of any tumor involvement.  This freed the lung.  The major  fissure was then developed and partial of it was divided with the EZ45  stapler. The arterial and venous branches of left upper lobe were  identified and sequentially divided with the vascular stapler.  The  bronchus was identified.  A TA-30 stapler was placed across the bronchus  and the bronchus divided.  The specimen was removed.  Pathology revealed  adenocarcinoma with negative bronchial margins.  Additional nodes in the  area 2 and area 11 were removed and submitted separately.  The  inferior  pole of the pulmonary ligament was freed.  The bronchial stump and the  lung suture line were inspected for air leak and were free of leak.  Two  28 chest tubes were placed, one anterior and one posterior.  The  approximately 3-4 cm incision was then closed with single pericostal  whip stitch.  The deep layers were closed with interrupted 0 Vicryl and  2-0 running subcuticular stitch was placed in the subcutaneous tissue  and 3-0 subcuticular stitch placed in the  skin edges.  Dry dressings were applied.  The patient was extubated in  the operating room and was transferred to recovery room for further  postoperative care.  In the subcutaneous tissue, in the deep muscle  layers, the On-Q device had been placed for administration of Marcaine.      Sheliah Plane, MD  Electronically Signed     EG/MEDQ  D:  04/20/2007  T:  04/21/2007  Job:  [id number]   cc:   Lajuana Matte, MD

## 2011-03-20 NOTE — Consult Note (Signed)
Jerome Price, Jerome Price NO.:  [id number]   MEDICAL RECORD NO.:  [id number]          PATIENT TYPE:  INP   LOCATION:  3316                         FACILITY:  [facility]   PHYSICIAN:  Lajuana Matte, MD  DATE OF BIRTH:  [birth year]   DATE OF CONSULTATION:  04/22/2007  DATE OF DISCHARGE:                                 CONSULTATION   REFERRING PHYSICIAN:  Sheliah Plane, MD   REASON FOR CONSULTATION:  A 60 year old white male diagnosed with non-  small-cell lung cancer.   HISTORY:  Mr. Matty is a very pleasant 60 year old white male, with no  significant past medical history, who had a chest x-ray performed on Mar 14, 2007, for preoperative evaluation for an eye surgery, and it showed  abnormal left upper lobe soft tissue mass questionable for lung  malignancy.  It was estimated to be 3.0 to 3.5 cm in diameter, and there  was a possible additional second pulmonary nodule in the right upper  lobe measuring 1.0 cm.  The patient had CT scan of the chest performed  on Mar 15, 2007, and it showed left upper lobe lung mass suspicious for  neoplasm.  It measured 3.4 x 3.0 cm.  No CT evidence for mediastinal  adenopathy, adrenal metastasis, or bone metastases were seen.  The  patient was then referred to Dr. Tyrone Sage, and on Mar 25, 2007, he had a  PET scan performed which showed left upper lobe mass demonstrating  malignant range FDG uptake without other areas of abnormal FDG uptake  noted.  The patient also had atherosclerotic-type changes and enlarged  prostate.  CT of the head performed on the same day showed no evidence  of acute intracranial abnormalities and no evidence of metastatic  disease.   On April 18, 2007, the patient underwent bronchoscopy in addition to left  mini thoracotomy with left upper lobectomy and lymph node dissection  under the care of Dr. Tyrone Sage, and the pathology revealed a 4.5 cm  poorly differentiated adenocarcinoma with tumor focally invaded  the  vessel visceropleural, but the bronchial margin was free of tumor, and  there were 5-9 peribronchial lymph nodes.  Lymph nodes from level 11L as  well as 2L were negative for metastatic carcinoma.  Dr. Tyrone Sage kindly  asked me to evaluate the patient today and discuss with him has adjuvant  therapy options. When seen today, the patient is doing fine. He has no  complaints. He still has a chest tube on the left side of the chest but  otherwise feeling well.   REVIEW OF SYSTEMS:  He has no fever or chills, no headache, no burry  vision or double vision.  He has no chest pain, shortness of breath,  cough, hemoptysis, syncope, or palpitations.  No nausea, vomiting,  abdominal pain, diarrhea, constipation, melena, or hematochezia.  No  dysuria or hematuria.   PAST MEDICAL HISTORY:  Unremarkable for any abnormalities.  The patient  denied having any history of diabetes mellitus, hypertension, stroke,  coronary artery disease,  or hypercholesterolemia.   FAMILY HISTORY:  Father died from lung  cancer at age 33.  He has two  brothers and one sister with no health problems, and his mother is still  alive.   SOCIAL HISTORY:  He is married.  He is self employed.  He runs a septic  tank service.  He has a history of smoking one pack a day for over 30  years, quit a few years ago.  No history of alcohol or drug abuse.   ALLERGIES:  No known drug allergies.   HOME MEDICATIONS:  None.   PHYSICAL EXAMINATION:  VITAL SIGNS:  Blood pressure 129/64, pulse 44,  respiratory rate 18, temperature 98.5, oxygen saturation 95% on room  air.  GENERAL:  Very pleasant 60 year old white male, awake, alert, in no  acute distress.  HEENT:  Normocephalic and atraumatic.  Clear oropharynx.  NECK:  Supple, no lymphadenopathy.  CHEST:  Clear to auscultation on the right.  Decreased breaths sounds in  the left lung field.  CARDIOVASCULAR:  Bradycardic, normal S1 and S2.  No murmur or gallop.  ABDOMEN:   Soft, nontender, nondistended.  No mass.  EXTREMITIES:  No edema.   LABORATORY DATA:  CBC and CMET on April 21, 2007, showed white blood  count 8.1, hemoglobin 12.3, hematocrit 36.4, platelets 206.  Sodium 135,  potassium 4.3, glucose 122, BUN 4, creatinine 1.0, calcium 8.5, alkaline  phosphatase 36, AST 24, ALT 9.   ASSESSMENT AND PLAN:  This is a very pleasant 60 year old white male  with recently diagnosed Stage IB (T2, N0, MX) non-small-cell lung  cancer, adenocarcinoma.  The patient had tumor size of 4.5 cm with  visceropleural involvement.  He is status post left upper lobectomy with  lymph node dissection on April 18, 2007.  I have had discussion with the  patient and his wife today about his treatment options of observation  versus adjuvant chemotherapy.  I explained to the patient that there was  no overall survival for adjuvant chemotherapy in Stage IB except when  patient had tumor size more than 4.0 cm where there was survival benefit  in this subset of patients.  The patient is interested in proceeding  with the chemotherapy, and he will be treated with adjuvant chemotherapy  with platinum-based regimen given every 3 weeks for a total of 4 cycles.  I expect the patient to start  the first cycle of chemotherapy within 4-  6 weeks after his surgery, and they will arrange for him to have a  followup appointment with me at the [facility] for more  detailed discussion of his chemotherapy regimen.   Thank you so much for allowing me to participate in the care of Mr.  Hollenbaugh.      Lajuana Matte, MD  Electronically Signed     MKM/MEDQ  D:  04/22/2007  T:  04/22/2007  Job:  [id number]   cc:   Sheliah Plane, MD  [facility], [facility]

## 2011-03-20 NOTE — Assessment & Plan Note (Signed)
HEALTHCARE                            CARDIOLOGY OFFICE NOTE   NAME:LEVINERondey, Fallen                         MRN:          [id number]  DATE:04/03/2007                            DOB:          1951/07/14    I was asked by Dr. Tyrone Sage to consult on Jerome Price for preoperative  clearance for an asymptomatic left upper lobe mass.   HISTORY OF PRESENT ILLNESS:  Jerome Price is a delightful 60 year old male  who has had really no significant medical problems in the past.  He was  undergoing a flight physical, and his ophthalmologists  who wants to do  a strabismus repair, ordered a chest x-ray.  It showed a left upper lobe  mass.  He was referred to Dr. Tyrone Sage.  A head CT with contrast showed  no evidence of metastatic disease.  PET scan showed a malignant range  FDG uptake.  He has some atherosclerotic changes that were noted with  carotic calcifications, aortic arch calcification, ectasia of the  ascending aorta at 4.4 to 4.1 and calcifications and ectasia of the  abdominal aorta.   He is totally asymptomatic from a cardiac perspective.   His risk factors are age, sex, history of remote tobacco which he quit  in 2000.  He does not know his lipid status.  He has no history of  hypertension or diabetes.  He rarely goes to the doctor.   PAST MEDICAL HISTORY:  No history of allergies.  He is on no  medications.  He is not allergic to any medications.  He drinks a few  beers every day.  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   SURGICAL HISTORY:  Eye surgery as a child.   FAMILY HISTORY:  Really noncontributory and negative for premature  coronary disease.   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  He really has no symptoms.  He is married and has no  children.   REVIEW OF SYSTEMS:  Negative other than the HPI.   PHYSICAL EXAMINATION:  His blood pressure is 145/86.  His pulse  is 69  and regular.  His weight is 179.  He is 5 feet 11 inches.  HEENT:  Normocephalic, atraumatic.  Pupils are equal, round and reactive  to light and accommodation.  Extraocular muscles are intact.  Sclerae:  Clear.  Facial symmetry is normal.  He has a moustache.  Dentition:  Satisfactory.  NECK:  Supple.  Carotid upstrokes are equal bilaterally without bruits.  There is no jugular venous distention.  Thyroid is not enlarged.  Trachea is midline.  LUNGS:  Reveal decreased breath sounds throughout.  CARDIOVASCULAR:  Nondisplaced PMI.  Normal S1, S2 without murmur or  gallop.  ABDOMEN:  Soft, no midline bruits.  No hepatomegaly.  No tenderness.  EXTREMITIES:  Reveal no clubbing, cyanosis or edema.  Pulses are intact.  NEUROLOGICAL:  Intact.  SKIN:  Intact.   EKG shows normal sinus rhythm with left anterior vesicular  block.   ASSESSMENT:  1. Left upper lobe mass, asymptomatic.  2. Multiple cardiac risk factors with calcification of his carotids,      ascending and arch of his aorta and descending aorta.  He probably      has some coronary disease.  We need to rule out obstructive plaque.  3. Chronic obstructive lung disease.  4. Remote tobacco.  5. Unknown lipid status.   He has also got borderline hypertension today.   PLAN:  1. Exercise stress test Myoview tomorrow which is Apr 04, 2007.  2. Check fasting lipids and LFTs.  3. Treat blood pressure.  Remains consistently above 135/85.   I spent about 30 minutes talking to him about acute coronary syndromes  and the way that exertional angina can present as well.  He knows how to  respond to this.     Thomas C. Daleen Squibb, MD, [facility]  Electronically Signed    TCW/MedQ  DD: 04/03/2007  DT: 04/03/2007  Job #: [id number]   cc:   Sheliah Plane, MD

## 2011-03-20 NOTE — Assessment & Plan Note (Signed)
OFFICE VISIT   Jerome Price, Jerome Price  DOB:  [birth year]                                        October 16, 2007  CHART #:  [id number]   The patient is now six months following his left upper lobe lobectomy  for stage 1B disease. He has had adjuvant chemotherapy, which has been  completed. A recent scan done reveals no evidence of ongoing disease at  this time. A chest x-ray done today reveals no significant abnormal  findings. He is feeling well. 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 He has also subsequently had his eye surgery.   PHYSICAL EXAMINATION:  VITAL SIGNS: Blood pressure 137/77, pulse 85,  respirations 18, oxygen saturation 93%.  In general, well-appearing male in no acute distress.  PULMONARY: Clear breath sounds without rales, rhonchi or wheeze.  CARDIAC: Regular rate and rhythm without murmurs, gallops or rubs.  LYMPH NODES: Reveal no palpable adenopathy.  Incision is well-healed without evidence of infection.   ASSESSMENT:  The patient continues to do quite well. He has a CT scan  scheduled through Dr. Asa Lente office in approximately six months. Dr.  Tyrone Sage will see the patient at that time for followup.   Rowe Clack, P.A.-C.   Sherryll Burger  D:  10/16/2007  T:  10/16/2007  Job:  [id number]   cc:   Sheliah Plane, MD  Tyrone Apple. Karleen Hampshire, M.D.  Thomas C. Daleen Squibb, MD, [facility]  Lajuana Matte, MD

## 2011-03-20 NOTE — Discharge Summary (Signed)
Jerome Price, Jerome Price NO.:  [id number]   MEDICAL RECORD NO.:  [id number]          PATIENT TYPE:  INP   LOCATION:  2036                         FACILITY:  [facility]   PHYSICIAN:  Sheliah Plane, MD    DATE OF BIRTH:  Feb 02, 1951   DATE OF ADMISSION:  04/18/2007  DATE OF DISCHARGE:  04/27/2007                               DISCHARGE SUMMARY   HISTORY OF PRESENT ILLNESS:  The patient is a 60 year old white male  referred to Dr. Tyrone Sage in thoracic surgery consultation.  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  This was being  considered and the patient on preop chest x-ray was found to have a left  upper lobe mass.  He was asymptomatic.  He was evaluated by multiple  studies including CT scan and PET scan which showed no evidence of  metastatic disease.  The patient also was seen in cardiology clearance  by Dr. Juanito Doom.  This was a low risk stress nuclear study with a fixed  inferior defect suggestive of previous infarct but no evidence of  ischemia and excellent functional capacity.  Pulmonary function tests  showed an FEV-1 of 2.68 and adequate diffusion capacity for resection.  He was admitted this hospitalization for resection.   PAST MEDICAL HISTORY:  Right eye surgery as a child, otherwise  unremarkable.   MEDICATIONS PRIOR TO ADMISSION:  None.   ALLERGIES:  None.   Family history, social history, review of systems and physical exam,  please see the history and physical done at the time of admission.   HOSPITAL COURSE:  The patient was admitted electively and on April 21, 2007 he was taken to the operating room at which time he underwent the  following procedure.  1. Bronchoscopy.  2. Left mini thoracotomy with left upper lobe lobectomy and lymph node      dissection.  He tolerated this procedure well and was taken to the      post anesthesia care unit in stable  condition.  Postoperative      hospital course:  The patient has done well.  He has remained      hemodynamically stable.  He did have a somewhat prolonged air leak.      This was managed conservatively with a chest tube.  The chest tube      was finally discontinued completely on April 27, 2007 and follow-up      chest x-ray reveals only a tiny left-sided pneumothorax.  His      incision is healing well without evidence of infection.  He is      tolerating routine activities commensurate for level of      postoperative convalescence using standard protocols.  He has been      seen in oncology consultation by Dr. Arbutus Ped.  He is a stage  I-B, T2, N0, MX, non-small cell adenocarcinoma with tumor size of 4.5 cm  and visceral pleura involvement.  The patient was felt to be of some  benefit  from adjuvant chemotherapy with platinum based regimen for a  total of four cycles.  This is planned to be given 4-6 weeks following  his surgery.  It will be arranged through Dr. Arbutus Ped at the cancer  center.  Currently the patient is felt to be quite stable for discharge  on today's date of April 27, 2007.   DISCHARGE MEDICATIONS:  1. Lopressor 12.5 mg b.i.d.  2. Albuterol inhaler four puffs q.6 h.  3. Guaifenesin 600 mg one tablet b.i.d.  4. Percocet 5/325 1-2 q.4-6 h p.r.n. for pain.   FOLLOWUP:  Follow-up will include Dr. Arbutus Ped as described.  Also he  will see Dr. Tyrone Sage on May 08, 2007 at 2:15 p.m. at the office with a  chest x-ray.   INSTRUCTIONS:  The patient received written instructions with regard to  medications, activity, diet, wound care and follow-up.   FINAL DIAGNOSIS:  Stage I-B lung adenocarcinoma as described above.   OTHER DIAGNOSES:  As per the history.      Rowe Clack, P.A.-C.      Sheliah Plane, MD  Electronically Signed    WEG/MEDQ  D:  04/27/2007  T:  04/27/2007  Job:  [id number]   cc:   Sheliah Plane, MD  Lajuana Matte, MD  Jesse Sans Wall, MD,  [facility]

## 2011-03-20 NOTE — Assessment & Plan Note (Signed)
OFFICE VISIT   Jerome Price, Jerome Price  DOB:  1951/01/12                                        April 09, 2007  CHART #:  [id number]   REASON FOR VISIT:  Follow up visit from pre-op evaluation for a left  upper lobe lung carcinoma.  Since last seen in the office, the patient  has had no further complaints or symptoms.  He has undergone a CT scan  of the head which showed no evidence of metastatic disease.  He also  underwent a PET scan which showed no evidence of mediastinal involvement  or any distant metastasis.  The PET scan showed a left upper lobe lung  mass in the malignant range.  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  Both because of his history of smoking and age and  calcification of the coronary arteries he was referred to Dr. Daleen Squibb, who  performed a Myoview stress test on Apr 04, 2007.  The report of this  shows low risk stress nuclear study with fixed inferior defects  suggestive of previous infarct, no evidence of ischemia and excellent  functional capacity.  Pulmonary function studies were performed which  showed an FEV1 of 2.68 and adequate diffusion capacities that appeared  to be adequate for a resection candidate.   With the preoperative studies completed, I have again reviewed with the  patient the risks and the options of surgical resection of the left  upper lobe, the risks of surgery including death, infection, pulmonary  embolus and prolonged air leak were all discussed, as was discussed  issues of staging and cure rates even with surgical resection.  The  patient is agreeable with proceeding, with tentatively plan for surgery  on June 13.   PHYSICAL EXAMINATION:  On exam today the patient's blood pressure is  147/74, pulse is 84, respiratory rate is 18.  His O2 sat is 95% on room  air.  His  breath sounds are distant.  I do not appreciate cervical or supraclavicular adenopathy.  He has no cardiac murmur.  He has no pedal edema.   The patient is currently on no medications, and is noted to have  somewhat elevated blood pressure.  With this in mind and to potentially  decrease the risk of atrial fibrillation I have perioperatively started  him on a low-dose beta blocker, 12.5 mg of Lopressor twice a day.   Sheliah Plane, MD  Electronically Signed   EG/MEDQ  D:  04/09/2007  T:  04/09/2007  Job:  [id number]   cc:   Casimiro Needle A. Karleen Hampshire, M.D.  Thomas C. Wall, MD, [facility]

## 2011-03-20 NOTE — Assessment & Plan Note (Signed)
OFFICE VISIT   Jerome Price, Jerome Price  DOB:  1950-11-19                                        May 08, 2007  CHART #:  [id number]   [facility] SURGICAL OFFICE VISIT   The patient on April 18, 2007 underwent left upper lobectomy for  adenocarcinoma of the lung.  He did well postoperatively and was  discharged home.  During this hospitalization he was seen by Dr. Arbutus Ped  in followup.  Chemotherapy was recommended.  Since discharge the patient  has made excellent progress postoperatively.  He saw Dr. Arbutus Ped several  days ago and is scheduled to start chemotherapy in approximately 2  weeks.  He has had no shortness of breath, no hemoptysis, no complaints  of wound difficulties.   On exam his blood pressure 118/72, pulse is 62 and regular, respiratory  rate is 18, O2 sat is 96%.  His left thoracotomy incision is healing  well.  The chest tube sutures were removed.  There is no evidence of  infection.  His lungs are clear bilaterally.  He has no pedal edema.   Followup chest x-ray shows clear lung fields with volume loss  appropriate for lobectomy and otherwise clear lung fields.   The patient seems to be making excellent progress postoperatively and  should be ready to start chemotherapy in the next 2 weeks as recommended  by Dr. Arbutus Ped.  I plan to see the patient  back in approximately 4 months after he completes his chemotherapy, as  he will be closely followed in the Oncology Clinic over that time.   Sheliah Plane, MD  Electronically Signed   EG/MEDQ  D:  05/08/2007  T:  05/09/2007  Job:  [id number]   cc:   Casimiro Needle A. Karleen Hampshire, M.D.  Thomas C. Daleen Squibb, MD, [facility]  Lajuana Matte, MD

## 2011-05-18 ENCOUNTER — Encounter (HOSPITAL_COMMUNITY): Payer: Self-pay

## 2011-05-18 ENCOUNTER — Ambulatory Visit (HOSPITAL_COMMUNITY)
Admission: RE | Admit: 2011-05-18 | Discharge: 2011-05-18 | Disposition: A | Discharge status: Home / Self Care | Payer: BC Managed Care – PPO | Source: Ambulatory Visit | Attending: Internal Medicine | Admitting: Internal Medicine

## 2011-05-18 ENCOUNTER — Other Ambulatory Visit: Payer: Self-pay | Admitting: Internal Medicine

## 2011-05-18 ENCOUNTER — Encounter (HOSPITAL_BASED_OUTPATIENT_CLINIC_OR_DEPARTMENT_OTHER): Payer: BC Managed Care – PPO | Admitting: Internal Medicine

## 2011-05-18 DIAGNOSIS — C3490 Malignant neoplasm of unspecified part of unspecified bronchus or lung: Secondary | ICD-10-CM | POA: Insufficient documentation

## 2011-05-18 DIAGNOSIS — J438 Other emphysema: Secondary | ICD-10-CM | POA: Insufficient documentation

## 2011-05-18 HISTORY — DX: Malignant (primary) neoplasm, unspecified: C80.1

## 2011-05-18 LAB — CBC WITH DIFFERENTIAL/PLATELET
BASO%: 0.6 % (ref 0.0–2.0)
EOS%: 2.1 % (ref 0.0–7.0)
HCT: 41.3 % (ref 38.4–49.9)
LYMPH%: 40.3 % (ref 14.0–49.0)
MCH: 31.4 pg (ref 27.2–33.4)
MCHC: 35.2 g/dL (ref 32.0–36.0)
MONO%: 9.7 % (ref 0.0–14.0)
NEUT%: 47.3 % (ref 39.0–75.0)
Platelets: 208 10*3/uL (ref 140–400)
RBC: 4.63 10*6/uL (ref 4.20–5.82)
WBC: 4.6 10*3/uL (ref 4.0–10.3)

## 2011-05-18 LAB — CMP (CANCER CENTER ONLY)
ALT(SGPT): 30 U/L (ref 10–47)
AST: 48 U/L — ABNORMAL HIGH (ref 11–38)
Alkaline Phosphatase: 62 U/L (ref 26–84)
Creat: 1.1 mg/dl (ref 0.6–1.2)
Sodium: 141 mEq/L (ref 128–145)
Total Bilirubin: 0.7 mg/dl (ref 0.20–1.60)
Total Protein: 6.9 g/dL (ref 6.4–8.1)

## 2011-05-18 MED ORDER — IOHEXOL 300 MG/ML  SOLN
80.0000 mL | Freq: Once | INTRAMUSCULAR | Status: AC | PRN
  Administered 2011-05-18: 80 mL via INTRAVENOUS

## 2011-05-22 ENCOUNTER — Encounter (HOSPITAL_BASED_OUTPATIENT_CLINIC_OR_DEPARTMENT_OTHER): Payer: BC Managed Care – PPO | Admitting: Internal Medicine

## 2011-05-22 DIAGNOSIS — C3410 Malignant neoplasm of upper lobe, unspecified bronchus or lung: Secondary | ICD-10-CM

## 2011-08-22 LAB — COMPREHENSIVE METABOLIC PANEL
ALT: 9
AST: 24
Albumin: 3.1 — ABNORMAL LOW
Alkaline Phosphatase: 36 — ABNORMAL LOW
BUN: 4 — ABNORMAL LOW
CO2: 26
Calcium: 8.5
Chloride: 102
Creatinine, Ser: 0.99
GFR calc Af Amer: >60
GFR calc non Af Amer: >60
Glucose, Bld: 122 — ABNORMAL HIGH
Potassium: 4.3
Sodium: 135
Total Bilirubin: 0.7
Total Protein: 6.1

## 2011-08-22 LAB — CBC
HCT: 36.4 — ABNORMAL LOW
Hemoglobin: 12.3 — ABNORMAL LOW
MCHC: 33.9
MCV: 86.8
Platelets: 206
RBC: 4.2 — ABNORMAL LOW
RDW: 13.3
WBC: 8.1

## 2011-08-23 LAB — COMPREHENSIVE METABOLIC PANEL
ALT: 13
AST: 26
Albumin: 4.1
Alkaline Phosphatase: 47
BUN: 14
CO2: 22
Calcium: 8.9
Chloride: 104
Creatinine, Ser: 1.02
GFR calc Af Amer: >60
GFR calc non Af Amer: >60
Glucose, Bld: 86
Potassium: 4.1
Sodium: 135
Total Bilirubin: 0.9
Total Protein: 7.3

## 2011-08-23 LAB — BASIC METABOLIC PANEL
BUN: 9
CO2: 25
Calcium: 8.4
Chloride: 105
Creatinine, Ser: 0.93
GFR calc Af Amer: >60
GFR calc non Af Amer: >60
Glucose, Bld: 160 — ABNORMAL HIGH
Potassium: 4.2
Sodium: 134 — ABNORMAL LOW

## 2011-08-23 LAB — BLOOD GAS, ARTERIAL
Acid-Base Excess: 0.1
Acid-Base Excess: 0.5
Bicarbonate: 23.9
Bicarbonate: 24.9 — ABNORMAL HIGH
Drawn by: 274481
FIO2: 0.21
FIO2: 0.21
O2 Saturation: 93.8
O2 Saturation: 96.8
Patient temperature: 37
Patient temperature: 97.1
TCO2: 25.1
TCO2: 26.2
pCO2 arterial: 37.1
pCO2 arterial: 40.7
pH, Arterial: 7.399
pH, Arterial: 7.425
pO2, Arterial: 68 — ABNORMAL LOW
pO2, Arterial: 86.1

## 2011-08-23 LAB — ABO/RH: ABO/RH(D): O POS

## 2011-08-23 LAB — CBC
HCT: 37.8 — ABNORMAL LOW
HCT: 41.7
Hemoglobin: 12.9 — ABNORMAL LOW
Hemoglobin: 14.1
MCHC: 33.9
MCHC: 34
MCV: 87.2
MCV: 87.4
Platelets: 234
Platelets: 267
RBC: 4.33
RBC: 4.78
RDW: 13.1
RDW: 13.3
WBC: 6.1
WBC: 9.4

## 2011-08-23 LAB — PROTIME-INR
INR: 0.9
Prothrombin Time: 12.1

## 2011-08-23 LAB — URINALYSIS, ROUTINE W REFLEX MICROSCOPIC
Bilirubin Urine: NEGATIVE
Glucose, UA: NEGATIVE
Hgb urine dipstick: NEGATIVE
Ketones, ur: NEGATIVE
Leukocytes, UA: NEGATIVE
Nitrite: NEGATIVE
Protein, ur: NEGATIVE
Specific Gravity, Urine: 1.02 (ref 1.005–1.035)
Urobilinogen, UA: 0.2
pH: 5.5

## 2011-08-23 LAB — TYPE AND SCREEN
ABO/RH(D): O POS
Antibody Screen: NEGATIVE

## 2011-08-23 LAB — APTT: aPTT: 27

## 2011-10-31 ENCOUNTER — Other Ambulatory Visit: Payer: Self-pay | Admitting: Internal Medicine

## 2011-10-31 DIAGNOSIS — C3490 Malignant neoplasm of unspecified part of unspecified bronchus or lung: Secondary | ICD-10-CM

## 2011-11-08 ENCOUNTER — Telehealth: Payer: Self-pay | Admitting: Internal Medicine

## 2011-11-08 NOTE — Telephone Encounter (Signed)
lmonvm advising the pt of his ct scan appt and the md appt in jan

## 2011-11-21 ENCOUNTER — Other Ambulatory Visit: Payer: BC Managed Care – PPO | Admitting: Lab

## 2011-11-21 ENCOUNTER — Other Ambulatory Visit (HOSPITAL_COMMUNITY): Payer: BC Managed Care – PPO

## 2011-11-26 ENCOUNTER — Ambulatory Visit: Payer: BC Managed Care – PPO | Admitting: Internal Medicine

## 2011-11-28 ENCOUNTER — Ambulatory Visit (HOSPITAL_COMMUNITY)
Admission: RE | Admit: 2011-11-28 | Discharge: 2011-11-28 | Disposition: A | Discharge status: Home / Self Care | Payer: BC Managed Care – PPO | Source: Ambulatory Visit | Attending: Internal Medicine | Admitting: Internal Medicine

## 2011-11-28 ENCOUNTER — Other Ambulatory Visit (HOSPITAL_BASED_OUTPATIENT_CLINIC_OR_DEPARTMENT_OTHER): Payer: BC Managed Care – PPO | Admitting: Lab

## 2011-11-28 ENCOUNTER — Other Ambulatory Visit (HOSPITAL_COMMUNITY): Payer: BC Managed Care – PPO

## 2011-11-28 DIAGNOSIS — R911 Solitary pulmonary nodule: Secondary | ICD-10-CM | POA: Insufficient documentation

## 2011-11-28 DIAGNOSIS — C3490 Malignant neoplasm of unspecified part of unspecified bronchus or lung: Secondary | ICD-10-CM

## 2011-11-28 DIAGNOSIS — C3410 Malignant neoplasm of upper lobe, unspecified bronchus or lung: Secondary | ICD-10-CM

## 2011-11-28 LAB — CBC WITH DIFFERENTIAL/PLATELET
BASO%: 0.5 % (ref 0.0–2.0)
Basophils Absolute: 0 10*3/uL (ref 0.0–0.1)
HCT: 44.2 % (ref 38.4–49.9)
HGB: 15.4 g/dL (ref 13.0–17.1)
LYMPH%: 27.3 % (ref 14.0–49.0)
MCHC: 34.9 g/dL (ref 32.0–36.0)
MONO#: 0.8 10*3/uL (ref 0.1–0.9)
NEUT%: 60 % (ref 39.0–75.0)
Platelets: 269 10*3/uL (ref 140–400)
WBC: 7.4 10*3/uL (ref 4.0–10.3)

## 2011-11-28 LAB — CMP (CANCER CENTER ONLY)
ALT(SGPT): 30 U/L (ref 10–47)
BUN, Bld: 10 mg/dL (ref 7–22)
CO2: 30 mEq/L (ref 18–33)
Calcium: 8.7 mg/dL (ref 8.0–10.3)
Creat: 1 mg/dl (ref 0.6–1.2)
Glucose, Bld: 106 mg/dL (ref 73–118)
Total Bilirubin: 0.7 mg/dl (ref 0.20–1.60)

## 2011-11-28 MED ORDER — IOHEXOL 300 MG/ML  SOLN
80.0000 mL | Freq: Once | INTRAMUSCULAR | Status: AC | PRN
  Administered 2011-11-28: 80 mL via INTRAVENOUS

## 2011-12-04 ENCOUNTER — Ambulatory Visit: Payer: BC Managed Care – PPO | Admitting: Internal Medicine

## 2011-12-07 ENCOUNTER — Telehealth: Payer: Self-pay | Admitting: Internal Medicine

## 2011-12-07 NOTE — Telephone Encounter (Signed)
Called to confirm next  appointment date and time . I confirmed it with pt.

## 2011-12-11 ENCOUNTER — Ambulatory Visit (HOSPITAL_BASED_OUTPATIENT_CLINIC_OR_DEPARTMENT_OTHER): Payer: BC Managed Care – PPO | Admitting: Internal Medicine

## 2011-12-11 ENCOUNTER — Telehealth: Payer: Self-pay | Admitting: Internal Medicine

## 2011-12-11 VITALS — BP 142/71 | HR 95 | Temp 97.0°F | Wt 179.7 lb

## 2011-12-11 DIAGNOSIS — Z9221 Personal history of antineoplastic chemotherapy: Secondary | ICD-10-CM

## 2011-12-11 DIAGNOSIS — C3490 Malignant neoplasm of unspecified part of unspecified bronchus or lung: Secondary | ICD-10-CM

## 2011-12-11 DIAGNOSIS — Z9889 Other specified postprocedural states: Secondary | ICD-10-CM

## 2011-12-11 DIAGNOSIS — Z85118 Personal history of other malignant neoplasm of bronchus and lung: Secondary | ICD-10-CM

## 2011-12-11 NOTE — Progress Notes (Signed)
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  PRIOR THERAPY:   1. Status post left upper lobectomy with lymph node dissection under the care of Dr. Tyrone Sage on April 18, 2007 and the pathology revealed 4.5-cm poorly differentiated adenocarcinoma with focal involvement of the 5th visceral pleura. 2. Status post 4 cycles of adjuvant chemotherapy with cisplatin and Taxotere, last dose was given 07/24/2007.  CURRENT THERAPY:  Observation.  INTERVAL HISTORY: Jerome Price 61 y.o. male returns to the clinic today for six-month followup visit. The patient has no complaints today. She denied having any significant weight loss or night sweats. He has no chest pain or shortness of breast, no cough or hemoptysis. He has repeat CT scan of the chest performed recently and he is here today for evaluation and discussion of his scan results.  MEDICAL HISTORY: Past Medical History  Diagnosis Date  . Cancer lung ca     dx'd 2008    ALLERGIES:   has no known allergies.  MEDICATIONS:  No current outpatient prescriptions on file.    REVIEW OF SYSTEMS:  A comprehensive review of systems was negative.   PHYSICAL EXAMINATION: General appearance: alert, cooperative and no distress Resp: clear to auscultation bilaterally Cardio: regular rate and rhythm, S1, S2 normal, no murmur, click, rub or gallop GI: soft, non-tender; bowel sounds normal; no masses,  no organomegaly Extremities: extremities normal, atraumatic, no cyanosis or edema Neurologic: Alert and oriented X 3, normal strength and tone. Normal symmetric reflexes. Normal coordination and gait  ECOG PERFORMANCE STATUS: 0 - Asymptomatic  Blood pressure 142/71, pulse 95, temperature 97 F (36.1 C), temperature source Oral, weight 179 lb 11.2 oz (81.511 kg).  LABORATORY DATA: Lab Results  Component Value Date   WBC 7.4 11/28/2011   HGB 15.4 11/28/2011   HCT 44.2 11/28/2011   MCV 90.4 11/28/2011   PLT 269 11/28/2011      Chemistry      Component Value Date/Time   NA 138 11/28/2011 0809   NA 139 05/18/2010 0800   K 4.4 11/28/2011 0809   K 4.4 05/18/2010 0800   CL 98 11/28/2011 0809   CL 105 05/18/2010 0800   CO2 30 11/28/2011 0809   CO2 28 05/18/2010 0800   BUN 10 11/28/2011 0809   BUN 11 05/18/2010 0800   CREATININE 1.0 11/28/2011 0809   CREATININE 1.12 05/18/2010 0800      Component Value Date/Time   CALCIUM 8.7 11/28/2011 0809   CALCIUM 8.9 05/18/2010 0800   ALKPHOS 66 11/28/2011 0809   ALKPHOS 48 05/18/2010 0800   AST 38 11/28/2011 0809   AST 33 05/18/2010 0800   ALT 21 05/18/2010 0800   BILITOT 0.70 11/28/2011 0809   BILITOT 0.8 05/18/2010 0800       RADIOGRAPHIC STUDIES: Ct Chest W Contrast  11/28/2011  *RADIOLOGY REPORT*  Clinical Data: Lung cancer diagnosed 03/2007, status post partial left lung resection, chemotherapy complete  CT CHEST WITH CONTRAST  Technique:  Multidetector CT imaging of the chest was performed following the standard protocol during bolus administration of intravenous contrast.  Contrast: 80mL OMNIPAQUE IOHEXOL 300 MG/ML IV SOLN  Comparison: 05/18/2011  Findings: Mild patchy/ground-glass opacity in the right upper lobe (series 5/image 17), nonspecific, likely infectious/inflammatory. Postsurgical changes with scarring in the left lung (series 5/image 45).  Dependent atelectasis at the right lung base.  Underlying moderate centrilobular emphysematous changes.  Scattered pulmonary nodules, including: --5 mm ground-glass nodule in the right upper lobe (series 5/image 23), stable --4 mm nodule in the central right upper lobe (series 5/image 26), new --6 mm ground-glass nodule in the right upper lobe (series 5/image 32), stable  Previously identified new 3 mm posterior upper lobe nodule is no longer visualized.  No pleural effusion or pneumothorax.  Visualized thyroid is unremarkable.   The heart is normal in size.  No pericardial effusion.  Coronary atherosclerosis.  Atherosclerotic calcifications of the aortic arch.  No suspicious mediastinal, hilar, or axillary lymphadenopathy.  Visualized upper abdomen is notable for hepatic steatosis.  Mild degenerative changes of the visualized thoracolumbar spine.  IMPRESSION: Postsurgical changes in the left lung.  No evidence of recurrent/metastatic disease in the chest.  Small right upper lobe pulmonary nodules, as described above. Prior 3 mm pulmonary nodule has resolved.  New 4 mm pulmonary nodule is likely benign, but attention on follow-up is suggested.  Moderate centrilobular emphysematous changes.  Original Report Authenticated By: Charline Bills, M.D.    ASSESSMENT: This is a very pleasant 61 years old white male with a history of a stage IB non-small cell lung cancer diagnosed in May of 2008 status post left upper lobectomy with lymph node dissection followed by 4 cycles of adjuvant chemotherapy. The patient has been observation since September of 2008 and he has no evidence for disease recurrence.   PLAN: I discussed the scan results with the patient and recommended for him continuous observation for now with repeat CT scan of the chest in one year. He would come back for followup visit at that time. He was advised to call me immediately she has any concerning symptoms in the interval.   All questions were answered. The patient knows to call the clinic with any problems, questions or concerns. We can certainly see the patient much sooner if necessary.

## 2011-12-11 NOTE — Telephone Encounter (Signed)
appts made and printed for 12/05/12 and 12/08/12  aom

## 2012-12-05 ENCOUNTER — Other Ambulatory Visit (HOSPITAL_BASED_OUTPATIENT_CLINIC_OR_DEPARTMENT_OTHER): Payer: BC Managed Care – PPO | Admitting: Lab

## 2012-12-05 ENCOUNTER — Encounter (HOSPITAL_COMMUNITY): Payer: Self-pay

## 2012-12-05 ENCOUNTER — Ambulatory Visit (HOSPITAL_COMMUNITY)
Admission: RE | Admit: 2012-12-05 | Discharge: 2012-12-05 | Disposition: A | Discharge status: Home / Self Care | Payer: BC Managed Care – PPO | Source: Ambulatory Visit | Attending: Internal Medicine | Admitting: Internal Medicine

## 2012-12-05 DIAGNOSIS — I700 Atherosclerosis of aorta: Secondary | ICD-10-CM | POA: Insufficient documentation

## 2012-12-05 DIAGNOSIS — K7689 Other specified diseases of liver: Secondary | ICD-10-CM | POA: Insufficient documentation

## 2012-12-05 DIAGNOSIS — C3490 Malignant neoplasm of unspecified part of unspecified bronchus or lung: Secondary | ICD-10-CM

## 2012-12-05 DIAGNOSIS — I2510 Atherosclerotic heart disease of native coronary artery without angina pectoris: Secondary | ICD-10-CM | POA: Insufficient documentation

## 2012-12-05 DIAGNOSIS — Z09 Encounter for follow-up examination after completed treatment for conditions other than malignant neoplasm: Secondary | ICD-10-CM | POA: Insufficient documentation

## 2012-12-05 LAB — CBC WITH DIFFERENTIAL/PLATELET
BASO%: 0.9 % (ref 0.0–2.0)
Eosinophils Absolute: 0.1 10*3/uL (ref 0.0–0.5)
LYMPH%: 43.4 % (ref 14.0–49.0)
MCHC: 33.5 g/dL (ref 32.0–36.0)
MONO#: 0.5 10*3/uL (ref 0.1–0.9)
MONO%: 10.7 % (ref 0.0–14.0)
NEUT#: 2.1 10*3/uL (ref 1.5–6.5)
RBC: 4.9 10*6/uL (ref 4.20–5.82)
RDW: 12.8 % (ref 11.0–14.6)
WBC: 4.8 10*3/uL (ref 4.0–10.3)

## 2012-12-05 LAB — COMPREHENSIVE METABOLIC PANEL (CC13)
ALT: 24 U/L (ref 0–55)
Albumin: 3.7 g/dL (ref 3.5–5.0)
CO2: 23 mEq/L (ref 22–29)
Calcium: 9.2 mg/dL (ref 8.4–10.4)
Chloride: 103 mEq/L (ref 98–107)
Glucose: 98 mg/dl (ref 70–99)
Potassium: 4.6 mEq/L (ref 3.5–5.1)
Sodium: 137 mEq/L (ref 136–145)
Total Protein: 7.2 g/dL (ref 6.4–8.3)

## 2012-12-05 MED ORDER — IOHEXOL 300 MG/ML  SOLN
80.0000 mL | Freq: Once | INTRAMUSCULAR | Status: AC | PRN
  Administered 2012-12-05: 80 mL via INTRAVENOUS

## 2012-12-08 ENCOUNTER — Ambulatory Visit: Payer: BC Managed Care – PPO | Admitting: Internal Medicine

## 2012-12-09 ENCOUNTER — Telehealth: Payer: Self-pay | Admitting: Internal Medicine

## 2012-12-10 ENCOUNTER — Other Ambulatory Visit (HOSPITAL_COMMUNITY): Payer: BC Managed Care – PPO

## 2012-12-10 ENCOUNTER — Encounter: Payer: Self-pay | Admitting: Internal Medicine

## 2012-12-10 ENCOUNTER — Telehealth: Payer: Self-pay | Admitting: Internal Medicine

## 2012-12-10 ENCOUNTER — Ambulatory Visit (HOSPITAL_BASED_OUTPATIENT_CLINIC_OR_DEPARTMENT_OTHER): Payer: BC Managed Care – PPO | Admitting: Internal Medicine

## 2012-12-10 VITALS — BP 149/84 | HR 91 | Temp 97.7°F | Resp 20 | Wt 181.9 lb

## 2012-12-10 DIAGNOSIS — C3490 Malignant neoplasm of unspecified part of unspecified bronchus or lung: Secondary | ICD-10-CM | POA: Insufficient documentation

## 2012-12-10 DIAGNOSIS — Z85118 Personal history of other malignant neoplasm of bronchus and lung: Secondary | ICD-10-CM

## 2012-12-10 NOTE — Patient Instructions (Signed)
No evidence for disease recurrence on the recent scan.  Followup in one year with repeat CT scan of the chest. 

## 2012-12-10 NOTE — Progress Notes (Signed)
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   PRIOR THERAPY:  1. Status post left upper lobectomy with lymph node dissection under the care of Dr. Tyrone Sage on April 18, 2007 and the pathology revealed 4.5-cm poorly differentiated adenocarcinoma with focal involvement of the 5th visceral pleura. 2. Status post 4 cycles of adjuvant chemotherapy with cisplatin and Taxotere, last dose was given 07/24/2007.  CURRENT THERAPY: Observation.  INTERVAL HISTORY: Jerome Price 62 y.o. male returns to the clinic today for routine annual followup visit. The patient is feeling fine today with no specific complaints. He denied having any significant chest pain, shortness breath, cough or hemoptysis. He denied having any weight loss or night sweats. He has repeat CT scan of the chest performed recently and he is here for evaluation and discussion of his scan results.  MEDICAL HISTORY: Past Medical History  Diagnosis Date  . Cancer lung ca     dx'd 2008    ALLERGIES:   has no known allergies.  MEDICATIONS:  No current outpatient prescriptions on file.    REVIEW OF SYSTEMS:  A comprehensive review of systems was negative.   PHYSICAL EXAMINATION: General appearance: alert, cooperative and no distress Head: Normocephalic, without obvious abnormality, atraumatic Neck: no adenopathy Lymph nodes: Cervical, supraclavicular, and axillary nodes normal. Resp: clear to auscultation bilaterally Cardio: regular rate and rhythm, S1, S2 normal, no murmur, click, rub or gallop GI: soft, non-tender; bowel sounds normal; no masses,  no organomegaly Extremities: extremities normal, atraumatic, no cyanosis or edema  ECOG PERFORMANCE STATUS: 0 - Asymptomatic  There were no vitals taken for this visit.  LABORATORY DATA: Lab Results    Component Value Date   WBC 4.8 12/05/2012   HGB 14.7 12/05/2012   HCT 43.9 12/05/2012   MCV 89.6 12/05/2012   PLT 212 12/05/2012      Chemistry      Component Value Date/Time   NA 137 12/05/2012 0809   NA 138 11/28/2011 0809   NA 139 05/18/2010 0800   K 4.6 12/05/2012 0809   K 4.4 11/28/2011 0809   K 4.4 05/18/2010 0800   CL 103 12/05/2012 0809   CL 98 11/28/2011 0809   CL 105 05/18/2010 0800   CO2 23 12/05/2012 0809   CO2 30 11/28/2011 0809   CO2 28 05/18/2010 0800   BUN 11.7 12/05/2012 0809   BUN 10 11/28/2011 0809   BUN 11 05/18/2010 0800   CREATININE 1.3 12/05/2012 0809   CREATININE 1.0 11/28/2011 0809   CREATININE 1.12 05/18/2010 0800      Component Value Date/Time   CALCIUM 9.2 12/05/2012 0809   CALCIUM 8.7 11/28/2011 0809   CALCIUM 8.9 05/18/2010 0800   ALKPHOS 70 12/05/2012 0809   ALKPHOS 66 11/28/2011 0809   ALKPHOS 48 05/18/2010 0800   AST 40* 12/05/2012 0809   AST 38 11/28/2011 0809   AST 33 05/18/2010 0800   ALT 24 12/05/2012 0809   ALT 21 05/18/2010 0800   BILITOT 0.38 12/05/2012 0809   BILITOT 0.70 11/28/2011 0809   BILITOT 0.8 05/18/2010 0800       RADIOGRAPHIC STUDIES: Ct Chest W Contrast  12/05/2012  *RADIOLOGY REPORT*  Clinical Data: Follow-up lung cancer.  History of left lung surgery.  CT CHEST WITH CONTRAST  Technique:  Multidetector CT imaging of the chest was  performed following the standard protocol during bolus administration of intravenous contrast.  Contrast: 80mL OMNIPAQUE IOHEXOL 300 MG/ML  SOLN  Comparison: 11/28/2011.  Findings: The chest wall is unremarkable and stable.  No masses or adenopathy.  The thyroid gland is normal.  The bony thorax is intact.  No destructive bone lesions or spinal canal compromise. Left rib changes likely from prior thoracotomy.  Evidence of remote healed left proximal clavicle fracture.  The heart is normal in size.  No pericardial effusion.  No mediastinal or hilar lymphadenopathy.  The aorta is normal in caliber.  No dissection.  Stable  atherosclerotic calcifications. Stable atherosclerotic calcification involving the branch vessels including the coronary arteries.  The esophagus is grossly normal.  Examination of the lung parenchyma demonstrates stable emphysematous changes.  No worrisome pulmonary nodules or mass lesions.  The faint ground-glass opacities seen on the prior examination are no longer visualized.  Minimal dependent atelectasis in the right lung and scarring changes in the left lung.  The upper abdomen demonstrates stable fatty infiltration of the liver.  No hepatic or adrenal gland lesions.  The visualized pancreas is normal.  IMPRESSION:  1.  Unremarkable chest CT examination.  No findings to suggest recurrent tumor or metastatic disease. 2.  Stable emphysematous changes but no acute findings. 3.  Stable atherosclerotic disease involving the aorta and branch vessels.   Original Report Authenticated By: Rudie Meyer, M.D.     ASSESSMENT: This is a very pleasant 62 years old white male with history of stage IB non-small cell lung cancer status post left upper lobectomy followed by 4 cycles of adjuvant chemotherapy and has been observation since September of 2008 with no evidence for disease recurrence.  PLAN: I discussed the scan results with the patient today. I recommended for him to continue on observation with repeat CT scan of the chest without contrast in one year.  He was advised to call me immediately if he has any concerning symptoms in the interval.  All questions were answered. The patient knows to call the clinic with any problems, questions or concerns. We can certainly see the patient much sooner if necessary.

## 2012-12-10 NOTE — Telephone Encounter (Signed)
gv and printed pt appt schedule for Feb 2015 °

## 2013-12-09 ENCOUNTER — Ambulatory Visit (HOSPITAL_COMMUNITY)
Admission: RE | Admit: 2013-12-09 | Discharge: 2013-12-09 | Disposition: A | Discharge status: Home / Self Care | Payer: BC Managed Care – PPO | Source: Ambulatory Visit | Attending: Internal Medicine | Admitting: Internal Medicine

## 2013-12-09 ENCOUNTER — Encounter (INDEPENDENT_AMBULATORY_CARE_PROVIDER_SITE_OTHER): Payer: Self-pay

## 2013-12-09 ENCOUNTER — Other Ambulatory Visit (HOSPITAL_BASED_OUTPATIENT_CLINIC_OR_DEPARTMENT_OTHER): Payer: BC Managed Care – PPO

## 2013-12-09 DIAGNOSIS — J438 Other emphysema: Secondary | ICD-10-CM | POA: Insufficient documentation

## 2013-12-09 DIAGNOSIS — Z85118 Personal history of other malignant neoplasm of bronchus and lung: Secondary | ICD-10-CM

## 2013-12-09 DIAGNOSIS — Z9221 Personal history of antineoplastic chemotherapy: Secondary | ICD-10-CM | POA: Insufficient documentation

## 2013-12-09 DIAGNOSIS — C3490 Malignant neoplasm of unspecified part of unspecified bronchus or lung: Secondary | ICD-10-CM

## 2013-12-09 LAB — CBC WITH DIFFERENTIAL/PLATELET
BASO%: 1.2 % (ref 0.0–2.0)
BASOS ABS: 0.1 10*3/uL (ref 0.0–0.1)
EOS%: 1.9 % (ref 0.0–7.0)
Eosinophils Absolute: 0.1 10*3/uL (ref 0.0–0.5)
HCT: 45.2 % (ref 38.4–49.9)
HEMOGLOBIN: 15.4 g/dL (ref 13.0–17.1)
LYMPH%: 43.8 % (ref 14.0–49.0)
MCH: 30.9 pg (ref 27.2–33.4)
MCHC: 34 g/dL (ref 32.0–36.0)
MCV: 91 fL (ref 79.3–98.0)
MONO#: 0.6 10*3/uL (ref 0.1–0.9)
MONO%: 11.8 % (ref 0.0–14.0)
NEUT#: 2.2 10*3/uL (ref 1.5–6.5)
NEUT%: 41.3 % (ref 39.0–75.0)
Platelets: 265 10*3/uL (ref 140–400)
RBC: 4.97 10*6/uL (ref 4.20–5.82)
RDW: 12.9 % (ref 11.0–14.6)
WBC: 5.3 10*3/uL (ref 4.0–10.3)
lymph#: 2.3 10*3/uL (ref 0.9–3.3)

## 2013-12-09 LAB — COMPREHENSIVE METABOLIC PANEL (CC13)
ALK PHOS: 76 U/L (ref 40–150)
ALT: 37 U/L (ref 0–55)
AST: 54 U/L — AB (ref 5–34)
Albumin: 4.2 g/dL (ref 3.5–5.0)
Anion Gap: 11 mEq/L (ref 3–11)
BILIRUBIN TOTAL: 0.57 mg/dL (ref 0.20–1.20)
BUN: 10.5 mg/dL (ref 7.0–26.0)
CO2: 23 mEq/L (ref 22–29)
CREATININE: 1 mg/dL (ref 0.7–1.3)
Calcium: 9.6 mg/dL (ref 8.4–10.4)
Chloride: 103 mEq/L (ref 98–109)
Glucose: 92 mg/dl (ref 70–140)
POTASSIUM: 4.6 meq/L (ref 3.5–5.1)
Sodium: 137 mEq/L (ref 136–145)
Total Protein: 7.4 g/dL (ref 6.4–8.3)

## 2013-12-10 ENCOUNTER — Ambulatory Visit (HOSPITAL_BASED_OUTPATIENT_CLINIC_OR_DEPARTMENT_OTHER): Payer: BC Managed Care – PPO | Admitting: Physician Assistant

## 2013-12-10 ENCOUNTER — Encounter: Payer: Self-pay | Admitting: Physician Assistant

## 2013-12-10 VITALS — BP 134/78 | HR 74 | Temp 97.0°F | Resp 19 | Wt 174.9 lb

## 2013-12-10 DIAGNOSIS — C3490 Malignant neoplasm of unspecified part of unspecified bronchus or lung: Secondary | ICD-10-CM

## 2013-12-10 DIAGNOSIS — Z85118 Personal history of other malignant neoplasm of bronchus and lung: Secondary | ICD-10-CM

## 2013-12-10 NOTE — Patient Instructions (Signed)
Your CT scan showed no evidence of recurrent or metastatic disease Followup in 1 year with another restaging CT scan of your chest to reevaluate your disease

## 2013-12-10 NOTE — Progress Notes (Addendum)
[place] Telephone:([phone]   Fax:(336) [phone]  SHARED VISIT PROGRESS NOTE  PRINCIPAL DIAGNOSIS: Stage IB (T2 N0 MX) non-small cell lung cancer, poorly differentiated carcinoma diagnosed in May 2008.   PRIOR THERAPY:  1. Status post left upper lobectomy with lymph node dissection under the care of Dr. Servando Snare on April 18, 2007 and the pathology revealed 4.5-cm poorly differentiated adenocarcinoma with focal involvement of the 5th visceral pleura. 2. Status post 4 cycles of adjuvant chemotherapy with cisplatin and Taxotere, last dose was given 07/24/2007.  CURRENT THERAPY: Observation.  INTERVAL HISTORY: Jerome Price 63 y.o. male returns to the clinic today for routine annual followup visit. The patient is feeling fine today with no specific complaints. He denied having any significant chest pain, shortness breath, cough or hemoptysis. He denied having any weight loss or night sweats. Any neurologic symptomatology. Specifically he denied any headaches, blurred or double vision, He had a repeat CT scan of the chest performed recently and he is here for evaluation and discussion of his scan results.  MEDICAL HISTORY: Past Medical History  Diagnosis Date  . Cancer lung ca     dx'd 2008    ALLERGIES:  has No Known Allergies.  MEDICATIONS:  No current outpatient prescriptions on file.   No current facility-administered medications for this visit.    REVIEW OF SYSTEMS:  Constitutional: negative Eyes: negative Ears, nose, mouth, throat, and face: negative Respiratory: negative Cardiovascular: negative Gastrointestinal: negative Genitourinary:negative Integument/breast: negative Hematologic/lymphatic: negative Musculoskeletal:negative Neurological: negative Behavioral/Psych: negative Endocrine: negative Allergic/Immunologic: negative   PHYSICAL EXAMINATION: General appearance: alert, cooperative and no distress Head: Normocephalic, without  obvious abnormality, atraumatic Neck: no adenopathy Lymph nodes: Cervical, supraclavicular, and axillary nodes normal. Resp: clear to auscultation bilaterally Cardio: regular rate and rhythm, S1, S2 normal, no murmur, click, rub or gallop GI: soft, non-tender; bowel sounds normal; no masses,  no organomegaly Extremities: extremities normal, atraumatic, no cyanosis or edema  ECOG PERFORMANCE STATUS: 0 - Asymptomatic  Blood pressure 134/78, pulse 74, temperature 97 F (36.1 C), temperature source Oral, resp. rate 19, weight 174 lb 14.4 oz (79.334 kg).  LABORATORY DATA: Lab Results  Component Value Date   WBC 5.3 12/09/2013   HGB 15.4 12/09/2013   HCT 45.2 12/09/2013   MCV 91.0 12/09/2013   PLT 265 12/09/2013      Chemistry      Component Value Date/Time   NA 137 12/09/2013 0807   NA 138 11/28/2011 0809   NA 139 05/18/2010 0800   K 4.6 12/09/2013 0807   K 4.4 11/28/2011 0809   K 4.4 05/18/2010 0800   CL 103 12/05/2012 0809   CL 98 11/28/2011 0809   CL 105 05/18/2010 0800   CO2 23 12/09/2013 0807   CO2 30 11/28/2011 0809   CO2 28 05/18/2010 0800   BUN 10.5 12/09/2013 0807   BUN 10 11/28/2011 0809   BUN 11 05/18/2010 0800   CREATININE 1.0 12/09/2013 0807   CREATININE 1.0 11/28/2011 0809   CREATININE 1.12 05/18/2010 0800      Component Value Date/Time   CALCIUM 9.6 12/09/2013 0807   CALCIUM 8.7 11/28/2011 0809   CALCIUM 8.9 05/18/2010 0800   ALKPHOS 76 12/09/2013 0807   ALKPHOS 66 11/28/2011 0809   ALKPHOS 48 05/18/2010 0800   AST 54* 12/09/2013 0807   AST 38 11/28/2011 0809   AST 33 05/18/2010 0800   ALT 37 12/09/2013 0807   ALT 30 11/28/2011 0809  ALT 21 05/18/2010 0800   BILITOT 0.57 12/09/2013 0807   BILITOT 0.70 11/28/2011 0809   BILITOT 0.8 05/18/2010 0800       RADIOGRAPHIC STUDIES:  CLINICAL DATA: Followup lung carcinoma. Previous surgery. Completed  chemotherapy.  EXAM:  CT CHEST WITHOUT CONTRAST  TECHNIQUE:  Multidetector CT imaging of the chest was performed following the  standard protocol  without IV contrast.  COMPARISON: 12/05/2012  FINDINGS:  Stable post treatment changes noted in left hemi thorax. Pulmonary  emphysema remains stable. No suspicious pulmonary nodules or masses  are identified. No evidence of acute infiltrate. No central  endobronchial lesion identified.  No evidence of mediastinal or hilar lymphadenopathy in this  noncontrast study. No adenopathy seen elsewhere within the thorax.  No evidence of chest wall mass or suspicious bone lesions. Both  adrenal glands are normal in appearance.  IMPRESSION:  Stable post treatment changes in left hemi thorax and emphysema. No  evidence of recurrent or metastatic carcinoma within the thorax. No  active lung disease.  Electronically Signed  By: Earle Gell M.D.  On: 12/09/2013 09:28     ASSESSMENT: This is a very pleasant 63 years old white male with history of stage IB non-small cell lung cancer status post left upper lobectomy followed by 4 cycles of adjuvant chemotherapy and has been observation since September of 2008 with no evidence for disease recurrence. His most recent restaging CT scan revealed no evidence for recurrent or metastatic disease.  PLAN: The patient was discussed with and also seen by Dr. Julien Nordmann. He is now 6 years and 7-1/2 months out from diagnosis. He will continue on observation and return in one year with repeat CT scan of the chest without contrast to reevaluate his disease. He is not exhibiting any neurologic symptomatology and at that this time imaging of his head is not indicated.    He was advised to call me immediately if he has any concerning symptoms in the interval.  All questions were answered. The patient knows to call the clinic with any problems, questions or concerns. We can certainly see the patient much sooner if necessary.  Carlton Adam PA-C  ADDENDUM: Hematology/Oncology Attending: I had the face to face encounter with the patient. I recommended his care plan.  This is a very pleasant 63 years old white male with history of a stage IB non-small cell lung cancer status post left upper lobectomy with lymph node dissection followed by 4 cycles of adjuvant chemotherapy and has been observation since September of 2008 was no significant evidence for disease recurrence. I discussed the scan results with the patient today. I recommended for him to continue on observation with repeat CT scan of the chest without contrast in one year. The patient has no neurological abnormalities or any other symptoms to indicate doing any other imaging studies at this point. He was advised to call immediately if he has any concerning symptoms in the interval.  Disclaimer: This note was dictated with voice recognition software. Similar sounding words can inadvertently be transcribed and may not be corrected upon review. Eilleen Kempf., MD 12/11/2013

## 2013-12-11 ENCOUNTER — Telehealth: Payer: Self-pay | Admitting: Internal Medicine

## 2013-12-11 NOTE — Telephone Encounter (Signed)
lvm for pt regarding to Feb appt...mailed pt appt sched and letter

## 2014-12-10 ENCOUNTER — Other Ambulatory Visit (HOSPITAL_BASED_OUTPATIENT_CLINIC_OR_DEPARTMENT_OTHER): Payer: BLUE CROSS/BLUE SHIELD

## 2014-12-10 ENCOUNTER — Ambulatory Visit (HOSPITAL_COMMUNITY)
Admission: RE | Admit: 2014-12-10 | Discharge: 2014-12-10 | Disposition: A | Discharge status: Home / Self Care | Payer: BLUE CROSS/BLUE SHIELD | Source: Ambulatory Visit | Attending: Physician Assistant | Admitting: Physician Assistant

## 2014-12-10 DIAGNOSIS — Z85118 Personal history of other malignant neoplasm of bronchus and lung: Secondary | ICD-10-CM

## 2014-12-10 DIAGNOSIS — Z9221 Personal history of antineoplastic chemotherapy: Secondary | ICD-10-CM | POA: Diagnosis not present

## 2014-12-10 DIAGNOSIS — C3490 Malignant neoplasm of unspecified part of unspecified bronchus or lung: Secondary | ICD-10-CM | POA: Insufficient documentation

## 2014-12-10 LAB — COMPREHENSIVE METABOLIC PANEL (CC13)
ALBUMIN: 3.8 g/dL (ref 3.5–5.0)
ALT: 26 U/L (ref 0–55)
ANION GAP: 9 meq/L (ref 3–11)
AST: 43 U/L — ABNORMAL HIGH (ref 5–34)
Alkaline Phosphatase: 74 U/L (ref 40–150)
BILIRUBIN TOTAL: 0.42 mg/dL (ref 0.20–1.20)
BUN: 10.8 mg/dL (ref 7.0–26.0)
CHLORIDE: 105 meq/L (ref 98–109)
CO2: 24 mEq/L (ref 22–29)
Calcium: 8.7 mg/dL (ref 8.4–10.4)
Creatinine: 1 mg/dL (ref 0.7–1.3)
EGFR: 79 mL/min/{1.73_m2} — ABNORMAL LOW (ref >90)
GLUCOSE: 92 mg/dL (ref 70–140)
Potassium: 4.7 mEq/L (ref 3.5–5.1)
SODIUM: 138 meq/L (ref 136–145)
Total Protein: 7 g/dL (ref 6.4–8.3)

## 2014-12-10 LAB — CBC WITH DIFFERENTIAL/PLATELET
BASO%: 0.5 % (ref 0.0–2.0)
Basophils Absolute: 0 10*3/uL (ref 0.0–0.1)
EOS ABS: 0.1 10*3/uL (ref 0.0–0.5)
EOS%: 1.4 % (ref 0.0–7.0)
HCT: 43.1 % (ref 38.4–49.9)
HGB: 14.5 g/dL (ref 13.0–17.1)
LYMPH%: 46.3 % (ref 14.0–49.0)
MCH: 30.3 pg (ref 27.2–33.4)
MCHC: 33.6 g/dL (ref 32.0–36.0)
MCV: 90.2 fL (ref 79.3–98.0)
MONO#: 0.4 10*3/uL (ref 0.1–0.9)
MONO%: 9.9 % (ref 0.0–14.0)
NEUT#: 1.7 10*3/uL (ref 1.5–6.5)
NEUT%: 41.9 % (ref 39.0–75.0)
PLATELETS: 215 10*3/uL (ref 140–400)
RBC: 4.78 10*6/uL (ref 4.20–5.82)
RDW: 12.3 % (ref 11.0–14.6)
WBC: 4.2 10*3/uL (ref 4.0–10.3)
lymph#: 1.9 10*3/uL (ref 0.9–3.3)

## 2014-12-13 ENCOUNTER — Ambulatory Visit (HOSPITAL_BASED_OUTPATIENT_CLINIC_OR_DEPARTMENT_OTHER): Payer: BLUE CROSS/BLUE SHIELD | Admitting: Internal Medicine

## 2014-12-13 ENCOUNTER — Encounter: Payer: Self-pay | Admitting: Internal Medicine

## 2014-12-13 ENCOUNTER — Telehealth: Payer: Self-pay | Admitting: Internal Medicine

## 2014-12-13 VITALS — BP 150/80 | HR 88 | Temp 98.4°F | Resp 18 | Ht 72.0 in | Wt 170.0 lb

## 2014-12-13 DIAGNOSIS — Z85118 Personal history of other malignant neoplasm of bronchus and lung: Secondary | ICD-10-CM | POA: Insufficient documentation

## 2014-12-13 DIAGNOSIS — C3412 Malignant neoplasm of upper lobe, left bronchus or lung: Secondary | ICD-10-CM | POA: Insufficient documentation

## 2014-12-13 NOTE — Progress Notes (Signed)
[facility] Telephone:([phone]   [phone]) [phone]  OFFICE PROGRESS NOTE  PRINCIPAL DIAGNOSIS: Stage IB (T2 N0 MX) non-small cell lung cancer, poorly differentiated carcinoma diagnosed in May 2008.   PRIOR THERAPY:  1. Status post left upper lobectomy with lymph node dissection under the care of Dr. Servando Snare on April 18, 2007 and the pathology revealed 4.5-cm poorly differentiated adenocarcinoma with focal involvement of the 5th visceral pleura. 2. Status post 4 cycles of adjuvant chemotherapy with cisplatin and Taxotere, last dose was given 07/24/2007.  CURRENT THERAPY: Observation.  INTERVAL HISTORY: Jerome Price 64 y.o. male returns to the clinic today for routine annual followup visit. The patient is feeling fine today with no specific complaints. He denied having any significant chest pain, shortness of breath, cough or hemoptysis. He denied having any weight loss or night sweats. He has repeat CT scan of the chest performed recently and he is here for evaluation and discussion of his scan results.  MEDICAL HISTORY: Past Medical History  Diagnosis Date  . Cancer lung ca     dx'd 2008    ALLERGIES:  has No Known Allergies.  MEDICATIONS:  No current outpatient prescriptions on file.   No current facility-administered medications for this visit.    REVIEW OF SYSTEMS:  A comprehensive review of systems was negative.   PHYSICAL EXAMINATION: General appearance: alert, cooperative and no distress Head: Normocephalic, without obvious abnormality, atraumatic Neck: no adenopathy Lymph nodes: Cervical, supraclavicular, and axillary nodes normal. Resp: clear to auscultation bilaterally Cardio: regular rate and rhythm, S1, S2 normal, no murmur, click, rub or gallop GI: soft, non-tender; bowel sounds normal; no masses,  no organomegaly Extremities: extremities normal, atraumatic, no cyanosis or edema  ECOG PERFORMANCE STATUS: 0 - Asymptomatic  Blood  pressure 150/80, pulse 88, temperature 98.4 F (36.9 C), temperature source Oral, resp. rate 18, height 6' (1.829 m), weight 170 lb (77.111 kg).  LABORATORY DATA: Lab Results  Component Value Date   WBC 4.2 12/10/2014   HGB 14.5 12/10/2014   HCT 43.1 12/10/2014   MCV 90.2 12/10/2014   PLT 215 12/10/2014      Chemistry      Component Value Date/Time   NA 138 12/10/2014 0817   NA 138 11/28/2011 0809   NA 139 05/18/2010 0800   K 4.7 12/10/2014 0817   K 4.4 11/28/2011 0809   K 4.4 05/18/2010 0800   CL 103 12/05/2012 0809   CL 98 11/28/2011 0809   CL 105 05/18/2010 0800   CO2 24 12/10/2014 0817   CO2 30 11/28/2011 0809   CO2 28 05/18/2010 0800   BUN 10.8 12/10/2014 0817   BUN 10 11/28/2011 0809   BUN 11 05/18/2010 0800   CREATININE 1.0 12/10/2014 0817   CREATININE 1.0 11/28/2011 0809   CREATININE 1.12 05/18/2010 0800      Component Value Date/Time   CALCIUM 8.7 12/10/2014 0817   CALCIUM 8.7 11/28/2011 0809   CALCIUM 8.9 05/18/2010 0800   ALKPHOS 74 12/10/2014 0817   ALKPHOS 66 11/28/2011 0809   ALKPHOS 48 05/18/2010 0800   AST 43* 12/10/2014 0817   AST 38 11/28/2011 0809   AST 33 05/18/2010 0800   ALT 26 12/10/2014 0817   ALT 30 11/28/2011 0809   ALT 21 05/18/2010 0800   BILITOT 0.42 12/10/2014 0817   BILITOT 0.70 11/28/2011 0809   BILITOT 0.8 05/18/2010 0800       RADIOGRAPHIC STUDIES: Ct Chest Wo Contrast  12/10/2014   CLINICAL DATA:  Subsequent evaluation for lung cancer diagnosed May 2008, chemotherapy completed September 2008 with history of left partial lung resection, for restaging of non small cell lung cancer with no current complaints  EXAM: CT CHEST WITHOUT CONTRAST  TECHNIQUE: Multidetector CT imaging of the chest was performed following the standard protocol without IV contrast.  COMPARISON:  12/09/2013  FINDINGS: No evidence of hilar or mediastinal adenopathy. Thoracic inlet is normal. No pleural or pericardial effusion.  Calcification of the thoracic  aorta stable. Coronary artery calcifications stable. Focal hepatic steatosis adjacent to the falciform ligament stable. Adrenal glands not visualized in their entirety but visualized portions are normal. No acute musculoskeletal findings.  Stable mild to moderate emphysematous change bilaterally. Mild subpleural fibrosis laterally in the left upper lobe, unchanged. Left lung is otherwise clear. On the right, there is mild interstitial density in the inferior medial right middle lobe, unchanged from prior study and likely reflecting in area of scarring or atelectasis. There is a sub cm sub solid right upper lobe opacity image number 31, measuring about 4 mm, which is stable. Further cranially in the right upper lobe on image number 24, there is a 1.5 cm opacity consisting primarily of faint linear densities. This does not appear masslike. There are no other significant opacities on the right. No endobronchial lesions identified.  IMPRESSION: Post treatment changes on the left are stable with no convincing evidence of recurrence or metastasis. Stable 4 mm right upper lobe opacity likely represents a focus of fibrosis. New nonspecific area of right upper lobe primarily interstitial opacity, with an appearance most suggestive of an area of mild subsegmental atelectasis or possibly mild focal inflammatory change. Imaging followup in 6-12 months could be considered.   Electronically Signed   By: Skipper Cliche M.D.   On: 12/10/2014 10:20   ASSESSMENT AND PLAN: This is a very pleasant 64 years old white male with history of stage IB non-small cell lung cancer status post left upper lobectomy followed by 4 cycles of adjuvant chemotherapy and has been observation since September of 2008 with no evidence for disease recurrence. The recent CT scan of the chest showed no evidence for disease recurrence but there was new nonspecific area of interstitial opacity in the right upper lobe. I discussed the scan results with the  patient today. I recommended for him to continue on observation with repeat CT scan of the chest in 6 months to reevaluate this area in the right upper lobe.  He was advised to call me immediately if he has any concerning symptoms in the interval.  All questions were answered. The patient knows to call the clinic with any problems, questions or concerns. We can certainly see the patient much sooner if necessary.  Disclaimer: This note was dictated with voice recognition software. Similar sounding words can inadvertently be transcribed and may be missed upon review.

## 2014-12-13 NOTE — Telephone Encounter (Signed)
Pt confirmed labs/ov per 02/08 POF, gave pt AVS.... KJ

## 2015-06-13 ENCOUNTER — Other Ambulatory Visit (HOSPITAL_BASED_OUTPATIENT_CLINIC_OR_DEPARTMENT_OTHER): Payer: BLUE CROSS/BLUE SHIELD

## 2015-06-13 ENCOUNTER — Encounter (HOSPITAL_COMMUNITY): Payer: Self-pay

## 2015-06-13 ENCOUNTER — Ambulatory Visit (HOSPITAL_COMMUNITY)
Admission: RE | Admit: 2015-06-13 | Discharge: 2015-06-13 | Disposition: A | Discharge status: Home / Self Care | Payer: BLUE CROSS/BLUE SHIELD | Source: Ambulatory Visit | Attending: Internal Medicine | Admitting: Internal Medicine

## 2015-06-13 DIAGNOSIS — Z85118 Personal history of other malignant neoplasm of bronchus and lung: Secondary | ICD-10-CM

## 2015-06-13 DIAGNOSIS — C3412 Malignant neoplasm of upper lobe, left bronchus or lung: Secondary | ICD-10-CM

## 2015-06-13 DIAGNOSIS — Z902 Acquired absence of lung [part of]: Secondary | ICD-10-CM | POA: Diagnosis not present

## 2015-06-13 DIAGNOSIS — K760 Fatty (change of) liver, not elsewhere classified: Secondary | ICD-10-CM | POA: Diagnosis not present

## 2015-06-13 DIAGNOSIS — I2510 Atherosclerotic heart disease of native coronary artery without angina pectoris: Secondary | ICD-10-CM | POA: Diagnosis not present

## 2015-06-13 LAB — CBC WITH DIFFERENTIAL/PLATELET
BASO%: 0.5 % (ref 0.0–2.0)
Basophils Absolute: 0 10*3/uL (ref 0.0–0.1)
EOS%: 1.6 % (ref 0.0–7.0)
Eosinophils Absolute: 0.1 10*3/uL (ref 0.0–0.5)
HCT: 40.6 % (ref 38.4–49.9)
HGB: 13.8 g/dL (ref 13.0–17.1)
LYMPH%: 38.6 % (ref 14.0–49.0)
MCH: 30.8 pg (ref 27.2–33.4)
MCHC: 34 g/dL (ref 32.0–36.0)
MCV: 90.6 fL (ref 79.3–98.0)
MONO#: 0.6 10*3/uL (ref 0.1–0.9)
MONO%: 12.8 % (ref 0.0–14.0)
NEUT%: 46.5 % (ref 39.0–75.0)
NEUTROS ABS: 2 10*3/uL (ref 1.5–6.5)
PLATELETS: 197 10*3/uL (ref 140–400)
RBC: 4.48 10*6/uL (ref 4.20–5.82)
RDW: 12.8 % (ref 11.0–14.6)
WBC: 4.3 10*3/uL (ref 4.0–10.3)
lymph#: 1.7 10*3/uL (ref 0.9–3.3)

## 2015-06-13 LAB — COMPREHENSIVE METABOLIC PANEL (CC13)
ALBUMIN: 3.6 g/dL (ref 3.5–5.0)
ALK PHOS: 77 U/L (ref 40–150)
ALT: 26 U/L (ref 0–55)
ANION GAP: 7 meq/L (ref 3–11)
AST: 44 U/L — AB (ref 5–34)
BUN: 11.2 mg/dL (ref 7.0–26.0)
CO2: 25 mEq/L (ref 22–29)
CREATININE: 1.1 mg/dL (ref 0.7–1.3)
Calcium: 8.5 mg/dL (ref 8.4–10.4)
Chloride: 107 mEq/L (ref 98–109)
EGFR: 70 mL/min/{1.73_m2} — AB (ref >90)
GLUCOSE: 87 mg/dL (ref 70–140)
POTASSIUM: 4.7 meq/L (ref 3.5–5.1)
Sodium: 140 mEq/L (ref 136–145)
Total Bilirubin: 0.48 mg/dL (ref 0.20–1.20)
Total Protein: 6.6 g/dL (ref 6.4–8.3)

## 2015-06-13 MED ORDER — IOHEXOL 300 MG/ML  SOLN
75.0000 mL | Freq: Once | INTRAMUSCULAR | Status: AC | PRN
  Administered 2015-06-13: 75 mL via INTRAVENOUS

## 2015-06-20 ENCOUNTER — Ambulatory Visit: Payer: BLUE CROSS/BLUE SHIELD | Admitting: Internal Medicine

## 2015-06-27 ENCOUNTER — Encounter: Payer: Self-pay | Admitting: Internal Medicine

## 2015-06-27 ENCOUNTER — Ambulatory Visit (HOSPITAL_BASED_OUTPATIENT_CLINIC_OR_DEPARTMENT_OTHER): Payer: BLUE CROSS/BLUE SHIELD | Admitting: Internal Medicine

## 2015-06-27 VITALS — BP 143/71 | HR 79 | Temp 98.1°F | Resp 17 | Ht 72.0 in | Wt 169.0 lb

## 2015-06-27 DIAGNOSIS — C3412 Malignant neoplasm of upper lobe, left bronchus or lung: Secondary | ICD-10-CM

## 2015-06-27 DIAGNOSIS — Z85118 Personal history of other malignant neoplasm of bronchus and lung: Secondary | ICD-10-CM | POA: Diagnosis not present

## 2015-06-27 NOTE — Progress Notes (Signed)
[facility] Telephone:([phone]   Fax:(336) [phone]  OFFICE PROGRESS NOTE  PRINCIPAL DIAGNOSIS: Stage IB (T2 N0 MX) non-small cell lung cancer, poorly differentiated carcinoma diagnosed in May 2008.   PRIOR THERAPY:  1. Status post left upper lobectomy with lymph node dissection under the care of Dr. Servando Snare on April 18, 2007 and the pathology revealed 4.5-cm poorly differentiated adenocarcinoma with focal involvement of the 5th visceral pleura. 2. Status post 4 cycles of adjuvant chemotherapy with cisplatin and Taxotere, last dose was given 07/24/2007.  CURRENT THERAPY: Observation.  INTERVAL HISTORY: Jerome Price 64 y.o. male returns to the clinic today for routine six-month followup visit. The patient is feeling fine today with no specific complaints. He denied having any significant chest pain, shortness of breath, cough or hemoptysis. He denied having any weight loss or night sweats. He was found on the previous CT scan of the chest performed 6 months ago to have a suspicious right upper lobe opacity. I recommended for the patient to have repeat CT scan of the chest which was performed recently and he is here for evaluation and discussion of his scan results.  MEDICAL HISTORY: Past Medical History  Diagnosis Date  . Cancer lung ca     dx'd 2008    ALLERGIES:  has No Known Allergies.  MEDICATIONS:  No current outpatient prescriptions on file.   No current facility-administered medications for this visit.    REVIEW OF SYSTEMS:  A comprehensive review of systems was negative.   PHYSICAL EXAMINATION: General appearance: alert, cooperative and no distress Head: Normocephalic, without obvious abnormality, atraumatic Neck: no adenopathy Lymph nodes: Cervical, supraclavicular, and axillary nodes normal. Resp: clear to auscultation bilaterally Cardio: regular rate and rhythm, S1, S2 normal, no murmur, click, rub or gallop GI: soft, non-tender; bowel  sounds normal; no masses,  no organomegaly Extremities: extremities normal, atraumatic, no cyanosis or edema  ECOG PERFORMANCE STATUS: 0 - Asymptomatic  Blood pressure 143/71, pulse 79, temperature 98.1 F (36.7 C), temperature source Oral, resp. rate 17, height 6' (1.829 m), weight 169 lb (76.658 kg), SpO2 97 %.  LABORATORY DATA: Lab Results  Component Value Date   WBC 4.3 06/13/2015   HGB 13.8 06/13/2015   HCT 40.6 06/13/2015   MCV 90.6 06/13/2015   PLT 197 06/13/2015      Chemistry      Component Value Date/Time   NA 140 06/13/2015 0807   NA 138 11/28/2011 0809   NA 139 05/18/2010 0800   K 4.7 06/13/2015 0807   K 4.4 11/28/2011 0809   K 4.4 05/18/2010 0800   CL 103 12/05/2012 0809   CL 98 11/28/2011 0809   CL 105 05/18/2010 0800   CO2 25 06/13/2015 0807   CO2 30 11/28/2011 0809   CO2 28 05/18/2010 0800   BUN 11.2 06/13/2015 0807   BUN 10 11/28/2011 0809   BUN 11 05/18/2010 0800   CREATININE 1.1 06/13/2015 0807   CREATININE 1.0 11/28/2011 0809   CREATININE 1.12 05/18/2010 0800      Component Value Date/Time   CALCIUM 8.5 06/13/2015 0807   CALCIUM 8.7 11/28/2011 0809   CALCIUM 8.9 05/18/2010 0800   ALKPHOS 77 06/13/2015 0807   ALKPHOS 66 11/28/2011 0809   ALKPHOS 48 05/18/2010 0800   AST 44* 06/13/2015 0807   AST 38 11/28/2011 0809   AST 33 05/18/2010 0800   ALT 26 06/13/2015 0807   ALT 30 11/28/2011 0809   ALT 21  05/18/2010 0800   BILITOT 0.48 06/13/2015 0807   BILITOT 0.70 11/28/2011 0809   BILITOT 0.8 05/18/2010 0800       RADIOGRAPHIC STUDIES: Ct Chest W Contrast  06/13/2015   CLINICAL DATA:  64 year old male with a history of resected left upper lobe lung cancer, presents for follow-up of a right upper lobe opacity.  EXAM: CT CHEST WITH CONTRAST  TECHNIQUE: Multidetector CT imaging of the chest was performed during intravenous contrast administration.  CONTRAST:  52m OMNIPAQUE IOHEXOL 300 MG/ML  SOLN  COMPARISON:  Most recent chest CT from  12/10/2014.  FINDINGS: Mediastinum/Nodes: Normal heart size. No pericardial fluid/thickening. There is atherosclerosis of the thoracic aorta, the great vessels of the mediastinum and the coronary arteries, including calcified atherosclerotic plaque in the left main and left anterior descending coronary arteries. Great vessels are normal in course and caliber. No central pulmonary emboli. Normal visualized thyroid. Normal esophagus. No axillary, mediastinal or hilar lymphadenopathy.  Lungs/Pleura: No pneumothorax. No pleural effusion. Stable mild centrilobular emphysema. Status post left upper lobectomy. There has been interval resolution of the previously described 1.5 cm peripheral right upper lobe opacity. There is a 3 mm ground-glass right upper lobe pulmonary nodule (series 5/image 30), which is unchanged since 2008. There is a central right upper lobe 5 mm pulmonary nodule (5/37), unchanged since at least 12/05/2012. There is stable scarring in the medial right upper lobe and basilar right middle lobe. There is stable subpleural reticulation in the dependent right lower lobe, likely representing hypoventilation. There is stable mild scarring in the peripheral basilar left lower lobe. No acute consolidative airspace disease. No new significant pulmonary nodules.  Upper abdomen: Diffuse hepatic steatosis, unchanged. Partially visualized is a renal cyst in the lateral upper left kidney measuring at least 1.5 cm, the visualized portions of which are simple.  Musculoskeletal: Stable symmetric mild bilateral gynecomastia. Moderate degenerative changes in the thoracic spine. No suspicious focal osseous lesions.  IMPRESSION: 1. Interval resolution of previously described 1.5 cm peripheral right upper lobe opacity, in keeping with benign inflammatory etiology. 2. Status post left upper lobectomy. No evidence of local tumor recurrence. No evidence of metastatic disease in the chest. No new significant pulmonary nodules.  No acute pulmonary disease. 3. Diffuse hepatic steatosis. 4. Atherosclerosis, including left main and 1 vessel coronary artery disease. Please note that although the presence of coronary artery calcium documents the presence of coronary artery disease, the severity of this disease and any potential stenosis cannot be assessed on this non-gated CT examination. Assessment for potential risk factor modification, dietary therapy or pharmacologic therapy may be warranted, if clinically indicated.   Electronically Signed   By: JIlona SorrelM.D.   On: 06/13/2015 09:37   ASSESSMENT AND PLAN: This is a very pleasant 64years old white male with history of stage IB non-small cell lung cancer status post left upper lobectomy followed by 4 cycles of adjuvant chemotherapy and has been observation since September of 2008 with no evidence for disease recurrence. The recent CT scan of the chest showed interval resolution of the previously described 1.5 cm peripheral right upper lobe opacity that most likely was benign inflammatory etiology. No other evidence concerning for disease recurrence. I discussed the scan results with the patient today. I recommended for him to continue on observation with routine follow-up visit with his primary care physician. I will discharge the patient from the clinic and see him on as-needed basis at this point. He was advised to call me immediately  if he has any concerning symptoms.  All questions were answered. The patient knows to call the clinic with any problems, questions or concerns. We can certainly see the patient much sooner if necessary.  Disclaimer: This note was dictated with voice recognition software. Similar sounding words can inadvertently be transcribed and may be missed upon review.

## 2015-11-05 ENCOUNTER — Ambulatory Visit (INDEPENDENT_AMBULATORY_CARE_PROVIDER_SITE_OTHER): Payer: BLUE CROSS/BLUE SHIELD

## 2015-11-05 ENCOUNTER — Ambulatory Visit (INDEPENDENT_AMBULATORY_CARE_PROVIDER_SITE_OTHER): Payer: BLUE CROSS/BLUE SHIELD | Admitting: Emergency Medicine

## 2015-11-05 VITALS — BP 136/84 | HR 98 | Temp 97.8°F | Resp 18 | Ht 73.0 in | Wt 175.4 lb

## 2015-11-05 DIAGNOSIS — R109 Unspecified abdominal pain: Secondary | ICD-10-CM

## 2015-11-05 DIAGNOSIS — I700 Atherosclerosis of aorta: Secondary | ICD-10-CM

## 2015-11-05 DIAGNOSIS — I2584 Coronary atherosclerosis due to calcified coronary lesion: Secondary | ICD-10-CM | POA: Diagnosis not present

## 2015-11-05 DIAGNOSIS — R10A2 Flank pain, left side: Secondary | ICD-10-CM

## 2015-11-05 DIAGNOSIS — I2510 Atherosclerotic heart disease of native coronary artery without angina pectoris: Secondary | ICD-10-CM | POA: Diagnosis not present

## 2015-11-05 DIAGNOSIS — E871 Hypo-osmolality and hyponatremia: Secondary | ICD-10-CM | POA: Diagnosis not present

## 2015-11-05 LAB — POC MICROSCOPIC URINALYSIS (UMFC): Mucus: ABSENT

## 2015-11-05 LAB — POCT CBC
GRANULOCYTE PERCENT: 62.1 % (ref 37–80)
HCT, POC: 42.8 % — AB (ref 43.5–53.7)
HEMOGLOBIN: 15.2 g/dL (ref 14.1–18.1)
Lymph, poc: 2.1 (ref 0.6–3.4)
MCH: 31.2 pg (ref 27–31.2)
MCHC: 35.4 g/dL (ref 31.8–35.4)
MCV: 88 fL (ref 80–97)
MID (cbc): 0.6 (ref 0–0.9)
MPV: 6.5 fL (ref 0–99.8)
PLATELET COUNT, POC: 218 10*3/uL (ref 142–424)
POC Granulocyte: 4.5 (ref 2–6.9)
POC LYMPH PERCENT: 29.8 %L (ref 10–50)
POC MID %: 8.1 %M (ref 0–12)
RBC: 4.87 M/uL (ref 4.69–6.13)
RDW, POC: 12.8 %
WBC: 7.2 10*3/uL (ref 4.6–10.2)

## 2015-11-05 LAB — POCT URINALYSIS DIP (MANUAL ENTRY)
Bilirubin, UA: NEGATIVE
Glucose, UA: NEGATIVE
Ketones, POC UA: NEGATIVE
Leukocytes, UA: NEGATIVE
Nitrite, UA: NEGATIVE
PH UA: 7
PROTEIN UA: NEGATIVE
RBC UA: NEGATIVE
SPEC GRAV UA: 1.01
UROBILINOGEN UA: 0.2

## 2015-11-05 LAB — BASIC METABOLIC PANEL WITH GFR
BUN: 11 mg/dL (ref 7–25)
CALCIUM: 9.2 mg/dL (ref 8.6–10.3)
CO2: 24 mmol/L (ref 20–31)
Chloride: 93 mmol/L — ABNORMAL LOW (ref 98–110)
Creat: 0.89 mg/dL (ref 0.70–1.25)
GLUCOSE: 98 mg/dL (ref 65–99)
POTASSIUM: 5.1 mmol/L (ref 3.5–5.3)
SODIUM: 128 mmol/L — AB (ref 135–146)

## 2015-11-05 MED ORDER — OXYCODONE-ACETAMINOPHEN 5-325 MG PO TABS: 1.0000 | ORAL_TABLET | Freq: Three times a day (TID) | ORAL | Status: DC | PRN

## 2015-11-05 NOTE — Progress Notes (Addendum)
Chief Complaint:  Chief Complaint  Patient presents with  . Flank Pain    x1 month, left sided pain, on and off for the past month. Mild pain for the past 3 days    HPI: Jerome Price is a 64 y.o. male who is here for  Patient presents with onset one month ago of left flank pain. This is a sharp pain  That radiates down his left lower back. This is a sharp pain. He is concerned about possible kidney stone.  Past Medical History  Diagnosis Date  . Cancer ([place]) lung ca     dx'd 2008   History reviewed. No pertinent past surgical history. Social History   Social History  . Marital Status: Married    Spouse Name: N/A  . Number of Children: N/A  . Years of Education: N/A   Social History Main Topics  . Smoking status: Former Smoker    Quit date: 11/05/1998  . Smokeless tobacco: None  . Alcohol Use: No  . Drug Use: No  . Sexual Activity: Not Asked   Other Topics Concern  . None   Social History Narrative   History reviewed. No pertinent family history. No Known Allergies Prior to Admission medications   Not on File     ROS: The patient denies fevers, chills, night sweats, unintentional weight loss, chest pain, palpitations, wheezing, dyspnea on exertion, nausea, vomiting, abdominal pain, dysuria, hematuria, melena, numbness, weakness, or tingling.  All other systems have been reviewed and were otherwise negative with the exception of those mentioned in the HPI and as above.    PHYSICAL EXAM: Filed Vitals:   11/05/15 1136  BP: 136/84  Pulse: 98  Temp: 97.8 F (36.6 C)  Resp: 18   Filed Vitals:   11/05/15 1136  Height: '6\' 1"'$  (1.854 m)  Weight: 175 lb 6.4 oz (79.561 kg)   Body mass index is 23.15 kg/(m^2).   General: Alert, no acute distress HEENT:  Normocephalic, atraumatic, oropharynx patent. EOMI, PERRLA Cardiovascular:  Regular rate and rhythm, no rubs murmurs or gallops.  No Carotid bruits, radial pulse intact. No pedal edema.  Respiratory:  Clear to auscultation bilaterally.  No wheezes, rales, or rhonchi.  No cyanosis, no use of accessory musculature GI: No organomegaly, abdomen is soft and non-tender, positive bowel sounds.  No masses. Skin: No rashes. Neurologic: Facial musculature symmetric. Psychiatric: Patient is appropriate throughout our interaction. Lymphatic: No cervical lymphadenopathy Musculoskeletal: Gait intact. EXT posterior tibial pulses 2+ absent dorsalis ped  LABS: Results for orders placed or performed in visit on 11/05/15  POCT urinalysis dipstick  Result Value Ref Range   Color, UA light yellow (A) yellow   Clarity, UA clear clear   Glucose, UA negative negative   Bilirubin, UA negative negative   Ketones, POC UA negative negative   Spec Grav, UA 1.010    Blood, UA negative negative   pH, UA 7.0    Protein Ur, POC negative negative   Urobilinogen, UA 0.2    Nitrite, UA Negative Negative   Leukocytes, UA Negative Negative  POCT Microscopic Urinalysis ([facility])  Result Value Ref Range   WBC,UR,HPF,POC None None WBC/hpf   RBC,UR,HPF,POC None None RBC/hpf   Bacteria None None, Too numerous to count   Mucus Absent Absent   Epithelial Cells, UR Per Microscopy None None, Too numerous to count cells/hpf  POCT CBC  Result Value Ref Range   WBC 7.2 4.6 - 10.2 K/uL   Lymph,  poc 2.1 0.6 - 3.4   POC LYMPH PERCENT 29.8 10 - 50 %L   MID (cbc) 0.6 0 - 0.9   POC MID % 8.1 0 - 12 %M   POC Granulocyte 4.5 2 - 6.9   Granulocyte percent 62.1 37 - 80 %G   RBC 4.87 4.69 - 6.13 M/uL   Hemoglobin 15.2 14.1 - 18.1 g/dL   HCT, POC 42.8 (A) 43.5 - 53.7 %   MCV 88.0 80 - 97 fL   MCH, POC 31.2 27 - 31.2 pg   MCHC 35.4 31.8 - 35.4 g/dL   RDW, POC 12.8 %   Platelet Count, POC 218 142 - 424 K/uL   MPV 6.5 0 - 99.8 fL     EKG/XRAY:   Primary read interpreted by [name] at [facility].there is significant bowel gas.Vascular calcification noted    ASSESSMENT/PLAN:  referral made to  Cardiology because of coronary  calcification seen on CT chest.CT abdomen pelvis ordered for left flank  Pain.he was started on a baby aspirin as primary prevention.oxycodone for pain. Sodium low question cause. Repeat on Friday.Concern over lung cancer history. Nena Jordan MD  Gross sideeffects, risk and benefits, and alternatives of medications d/w patient. Patient is aware that all medications have potential sideeffects and we are unable to predict every sideeffect or drug-drug interaction that may occur.  '@[facility]'$ @ 11/05/2015 11:57 AM

## 2015-11-10 ENCOUNTER — Telehealth: Payer: Self-pay

## 2015-11-10 NOTE — Telephone Encounter (Signed)
Tell him I am glad he is feeling better and be sure he follows up with the cardiologist.

## 2015-11-10 NOTE — Telephone Encounter (Signed)
Pt advised.

## 2015-11-10 NOTE — Telephone Encounter (Signed)
Patient is calling to let [name] know that his back is getting better and he has an appointment scheduled with a cardiologist.

## 2015-11-21 ENCOUNTER — Encounter: Payer: Self-pay | Admitting: Interventional Cardiology

## 2015-11-30 ENCOUNTER — Encounter: Payer: Self-pay | Admitting: Interventional Cardiology

## 2015-11-30 ENCOUNTER — Ambulatory Visit (INDEPENDENT_AMBULATORY_CARE_PROVIDER_SITE_OTHER): Payer: BLUE CROSS/BLUE SHIELD | Admitting: Interventional Cardiology

## 2015-11-30 VITALS — BP 138/60 | HR 80 | Ht 73.0 in | Wt 174.0 lb

## 2015-11-30 DIAGNOSIS — I2510 Atherosclerotic heart disease of native coronary artery without angina pectoris: Secondary | ICD-10-CM | POA: Diagnosis not present

## 2015-11-30 DIAGNOSIS — C3412 Malignant neoplasm of upper lobe, left bronchus or lung: Secondary | ICD-10-CM

## 2015-11-30 DIAGNOSIS — Z87891 Personal history of nicotine dependence: Secondary | ICD-10-CM | POA: Diagnosis not present

## 2015-11-30 DIAGNOSIS — I2584 Coronary atherosclerosis due to calcified coronary lesion: Secondary | ICD-10-CM | POA: Diagnosis not present

## 2015-11-30 DIAGNOSIS — R10A0 Flank pain, unspecified side: Secondary | ICD-10-CM

## 2015-11-30 DIAGNOSIS — R109 Unspecified abdominal pain: Secondary | ICD-10-CM

## 2015-11-30 NOTE — Addendum Note (Signed)
Addended by: Loren Racer on: 11/30/2015 12:00 PM   Modules accepted: Orders

## 2015-11-30 NOTE — Progress Notes (Signed)
Patient ID: Jerome Price, male   DOB: 1951-05-20, 65 y.o.   MRN: [id number]     Cardiology Office Note   Date:  11/30/2015   ID:  Jerome Price, DOB 23-Nov-1950, MRN [id number]  PCP:  No PCP Per Patient    No chief complaint on file.  Left flank pain  Wt Readings from Last 3 Encounters:  11/30/15 174 lb (78.926 kg)  11/05/15 175 lb 6.4 oz (79.561 kg)  06/27/15 169 lb (76.658 kg)       History of Present Illness: Jerome Price is a 65 y.o. male who has a history of lung cancer.  He was treated in 2008.  He had surgery and chemotherapy at that time.  He haas been cancer free and was released from oncology care in 2016.   He had some flank pain.  THis was evaluated by CT scan and coronary calcifications were found.  He is here for further evaluation.    He goes to the gym nearly daily.  He does circuit training No chest pain or shortness of breath.    He has a long smoking history but quite 17 years ago.  He quit thinking about his father's lung cancer diagnosis.     Past Medical History  Diagnosis Date  . Cancer ([place]) lung ca     dx'd 2008    History reviewed. No pertinent past surgical history.   No current outpatient prescriptions on file.   No current facility-administered medications for this visit.    Allergies:   Review of patient's allergies indicates no known allergies.    Social History:  The patient  reports that he quit smoking about 17 years ago. He does not have any smokeless tobacco history on file. He reports that he does not drink alcohol or use illicit drugs.   Family History:  The patient's family history includes Lung cancer in his father. There is no history of Heart attack, Hypertension, or Stroke.    ROS:  Please see the history of present illness.   Otherwise, review of systems are positive for improving left flank pain.   All other systems are reviewed and negative.    PHYSICAL EXAM: VS:  BP 138/60 mmHg  Pulse 80  Ht '6\' 1"'$  (1.854 m)   Wt 174 lb (78.926 kg)  BMI 22.96 kg/m2 , BMI Body mass index is 22.96 kg/(m^2). GEN: Well nourished, well developed, in no acute distress HEENT: normal Neck: no JVD, carotid bruits, or masses Cardiac: RRR; no murmurs, rubs, or gallops,no edema  Respiratory:  clear to auscultation bilaterally, normal work of breathing, decreased breath sounds at left base GI: soft, nontender, nondistended, + BS MS: no deformity or atrophy Skin: warm and dry, no rash Neuro:  Strength and sensation are intact Psych: euthymic mood, full affect   EKG:   The ekg ordered today demonstrates NSR, IRBBB   Recent Labs: 06/13/2015: ALT 26; Platelets 197 11/05/2015: BUN 11; Creat 0.89; Hemoglobin 15.2; Potassium 5.1; Sodium 128*   Lipid Panel No results found for: CHOL, TRIG, HDL, CHOLHDL, VLDL, LDLCALC, LDLDIRECT   Other studies Reviewed: Additional studies/ records that were reviewed today with results demonstrating: CT scan showing coronary calcifications.   ASSESSMENT AND PLAN:  1. Coronary calcification: Noted by CT scan. Will need a functional study to evaluate for ischemia. Plan stress echocardiogram.  No symptoms of angina. No early family history of coronary artery disease. 2. Risk factors for CAD include history of tobacco abuse. He has  never had his cholesterol checked. He'll need fasting lipids. 3. I asked him to try to reduce the sodium in his diet. His blood pressures are at the upper end of the normal range. With regular exercise and decrease sodium intake, he may be able to avoid blood pressure medicines. 4. H/o lung cancer- cured with surgery and chemo.    Current medicines are reviewed at length with the patient today.  The patient concerns regarding his medicines were addressed.  The following changes have been made:  No change  Labs/ tests ordered today include:  No orders of the defined types were placed in this encounter.    Recommend 150 minutes/week of aerobic exercise Low fat,  low carb, high fiber diet recommended  Disposition:   FU for stress test   Teresita Madura., MD  11/30/2015 8:51 AM    [facility] [address], [place], [place]  [address] Phone: [phone]; Fax: [phone]

## 2015-11-30 NOTE — Patient Instructions (Signed)
Medication Instructions:  None  Labwork: Your physician recommends that you return for lab work when fasting (Lipids, CMET)  Testing/Procedures: Your physician has requested that you have a stress echocardiogram. For further information please visit [email or web address]. Please follow instruction sheet as given.   Follow-Up: Your physician recommends that you schedule a follow-up appointment as needed with Dr. Irish Lack.    Any Other Special Instructions Will Be Listed Below (If Applicable).     If you need a refill on your cardiac medications before your next appointment, please call your pharmacy.

## 2015-12-02 ENCOUNTER — Other Ambulatory Visit (INDEPENDENT_AMBULATORY_CARE_PROVIDER_SITE_OTHER): Payer: BLUE CROSS/BLUE SHIELD | Admitting: *Deleted

## 2015-12-02 DIAGNOSIS — I2510 Atherosclerotic heart disease of native coronary artery without angina pectoris: Secondary | ICD-10-CM | POA: Diagnosis not present

## 2015-12-02 DIAGNOSIS — I2584 Coronary atherosclerosis due to calcified coronary lesion: Secondary | ICD-10-CM | POA: Diagnosis not present

## 2015-12-02 LAB — COMPREHENSIVE METABOLIC PANEL
ALT: 16 U/L (ref 9–46)
AST: 31 U/L (ref 10–35)
Albumin: 3.8 g/dL (ref 3.6–5.1)
Alkaline Phosphatase: 61 U/L (ref 40–115)
BUN: 10 mg/dL (ref 7–25)
CHLORIDE: 102 mmol/L (ref 98–110)
CO2: 26 mmol/L (ref 20–31)
CREATININE: 1.03 mg/dL (ref 0.70–1.25)
Calcium: 8.6 mg/dL (ref 8.6–10.3)
GLUCOSE: 80 mg/dL (ref 65–99)
Potassium: 4.4 mmol/L (ref 3.5–5.3)
SODIUM: 138 mmol/L (ref 135–146)
TOTAL PROTEIN: 6.9 g/dL (ref 6.1–8.1)
Total Bilirubin: 0.4 mg/dL (ref 0.2–1.2)

## 2015-12-02 LAB — LIPID PANEL
CHOL/HDL RATIO: 3 ratio (ref <=5.0)
Cholesterol: 160 mg/dL (ref 125–200)
HDL: 54 mg/dL (ref >=40)
LDL CALC: 52 mg/dL (ref <130)
Triglycerides: 272 mg/dL — ABNORMAL HIGH (ref <150)
VLDL: 54 mg/dL — ABNORMAL HIGH (ref <30)

## 2015-12-02 NOTE — Addendum Note (Signed)
Addended by: Eulis Foster on: 12/02/2015 07:33 AM   Modules accepted: Orders

## 2015-12-08 ENCOUNTER — Telehealth: Payer: Self-pay | Admitting: Interventional Cardiology

## 2015-12-08 NOTE — Telephone Encounter (Signed)
**Note De-Identified Jerome Price Obfuscation** The pt has been given his recent lab results and he verbalized understanding.

## 2015-12-08 NOTE — Telephone Encounter (Signed)
New message ° ° ° ° °Returning a call to the nurse to get lab results °

## 2015-12-12 ENCOUNTER — Encounter: Payer: Self-pay | Admitting: Cardiology

## 2016-01-02 ENCOUNTER — Other Ambulatory Visit (HOSPITAL_COMMUNITY): Payer: BLUE CROSS/BLUE SHIELD

## 2016-01-10 ENCOUNTER — Ambulatory Visit (HOSPITAL_BASED_OUTPATIENT_CLINIC_OR_DEPARTMENT_OTHER): Payer: BLUE CROSS/BLUE SHIELD

## 2016-01-10 ENCOUNTER — Ambulatory Visit (HOSPITAL_COMMUNITY): Payer: BLUE CROSS/BLUE SHIELD | Attending: Cardiology

## 2016-01-10 DIAGNOSIS — I2510 Atherosclerotic heart disease of native coronary artery without angina pectoris: Secondary | ICD-10-CM | POA: Diagnosis not present

## 2016-01-10 DIAGNOSIS — R0989 Other specified symptoms and signs involving the circulatory and respiratory systems: Secondary | ICD-10-CM

## 2016-01-10 DIAGNOSIS — I2584 Coronary atherosclerosis due to calcified coronary lesion: Secondary | ICD-10-CM | POA: Insufficient documentation

## 2016-01-31 ENCOUNTER — Ambulatory Visit (INDEPENDENT_AMBULATORY_CARE_PROVIDER_SITE_OTHER): Payer: BLUE CROSS/BLUE SHIELD | Admitting: Physician Assistant

## 2016-01-31 VITALS — BP 163/90 | HR 86 | Temp 98.1°F | Resp 16 | Ht 72.0 in | Wt 171.0 lb

## 2016-01-31 DIAGNOSIS — M25422 Effusion, left elbow: Secondary | ICD-10-CM | POA: Diagnosis not present

## 2016-01-31 DIAGNOSIS — R030 Elevated blood-pressure reading, without diagnosis of hypertension: Secondary | ICD-10-CM

## 2016-01-31 DIAGNOSIS — IMO0001 Reserved for inherently not codable concepts without codable children: Secondary | ICD-10-CM

## 2016-01-31 NOTE — Patient Instructions (Addendum)
Elbow Bursitis Elbow bursitis is inflammation of the fluid-filled sac (bursa) between the tip of your elbow bone (olecranon) and your skin. Elbow bursitis may also be called olecranon bursitis. Normally, the olecranon bursa has only a small amount of fluid in it to cushion and protect your elbow bone. Elbow bursitis causes fluid to build up inside the bursa. Over time, this swelling and inflammation can cause pain when you bend or lean on your elbow.  CAUSES Elbow bursitis may be caused by:   Elbow injury (acute trauma).  Leaning on hard surfaces for long periods of time.  Infection from an injury that breaks the skin near your elbow.  A bone growth (spur) that forms at the tip of your elbow.  A medical condition that causes inflammation in your body, such as gout or rheumatoid arthritis.  The cause may also be unknown.  SIGNS AND SYMPTOMS  The first sign of elbow bursitis is usually swelling over the tip of your elbow. This can grow to be the size of a golf ball. This may start suddenly or develop gradually. You may also have:  Pain when bending or leaning on your elbow.  Restricted movement of your elbow.  If your bursitis is caused by an infection, symptoms may also include:  Redness, warmth, and tenderness of the elbow.  Drainage of pus from the swollen area over your elbow, if the skin breaks open. DIAGNOSIS  Your health care provider may be able to diagnose elbow bursitis based on your signs and symptoms, especially if you have recently been injured. Your health care provider will also do a physical exam. This may include:  X-rays to look for a bone spur or a bone fracture.  Draining fluid from the bursa to test it for infection.  Blood tests to rule out gout or rheumatoid arthritis. TREATMENT  Treatment for elbow bursitis depends on the cause. Treatment may include:  Medicines. These may include:  Over-the-counter medicines to relieve pain and  inflammation.  Antibiotic medicines to fight infection.  Injections of anti-inflammatory medicines (steroids).  Wrapping your elbow with a bandage.  Draining fluid from the bursa.  Wearing elbow pads.  If your bursitis does not get better with treatment, surgery may be needed to remove the bursa.  HOME CARE INSTRUCTIONS   Take medicines only as directed by your health care provider.  If you were prescribed an antibiotic medicine, finish all of it even if you start to feel better.  If your bursitis is caused by an injury, rest your elbow and wear your bandage as directed by your health care provider. You may alsoapply ice to the injured area as directed by your health care provider:  Put ice in a plastic bag.  Place a towel between your skin and the bag.  Leave the ice on for 20 minutes, 2-3 times per day.  Avoid any activities that cause elbow pain.  Use elbow pads or elbow wraps to cushion your elbow. SEEK MEDICAL CARE IF:  You have a fever.   Your symptoms do not get better with treatment.  Your pain or swelling gets worse.  Your elbow pain or swelling goes away and then returns.  You have drainage of pus from the swollen area over your elbow.   This information is not intended to replace advice given to you by your health care provider. Make sure you discuss any questions you have with your health care provider.   Document Released: 11/21/2006 Document Revised: 11/12/2014 Document  Reviewed: 06/30/2014 Elsevier Interactive Patient Education [employer].     IF you received an x-ray today, you will receive an invoice from [facility]. Please contact [facility] at [phone] with questions or concerns regarding your invoice.   IF you received labwork today, you will receive an invoice from [employer]. Please contact [facility] at [phone] with questions or concerns regarding your invoice.   Our billing  staff will not be able to assist you with questions regarding bills from these companies.  You will be contacted with the lab results as soon as they are available. The fastest way to get your results is to activate your My Chart account. Instructions are located on the last page of this paperwork. If you have not heard from [place] regarding the results in 2 weeks, please contact this office.

## 2016-01-31 NOTE — Progress Notes (Signed)
Subjective:     Patient ID: Jerome Price, male   DOB: 18-Jan-1951, 64 y.o.   MRN: [id number]  HPI The patient is a 65 year old white male with PMH of lung cancer and tobacco abuse who presents today with acute left sided elbow pain.  The patient started working out with group based kickboxing 4 month ago with no concerns. 4 days ago he was working out his elbows on the punching bag, no pain or injury occurred in the class. 3 days ago noticed increased some swelling and warmth which has increased which promoted him to come to the office today. No difficulty or pain with range of motion. Denies recent fevers, chills, fatigue or myalgias.    Review of Systems All pertinent ROS as above in HPI    Objective: Filed Vitals:   01/31/16 1321  BP: 163/90  Pulse: 86  Temp: 98.1 F (36.7 C)  Resp: 16      Physical Exam  Constitutional: He appears well-developed and well-nourished.  Musculoskeletal: Normal range of motion.  Full ROM of elbows, shoulders and wrists. 5/5 muscle strength with no deficits.  Neurological: He has normal reflexes.  Skin: Skin is warm and dry.  Diffuse hyperpigmentation. Swelling and warmth to touch of the posterior elbow, 9cm X 5cm. No pain with movement, non-tender to palpation.     Procedure: Consent obtained, the area was cleaned with iodine swabs. Ethyl chloride spray used to numb the area, 18 gage needle with syringe injected into the left olecranon bursa sac. Two full 30m syringes of serous fluid were aspirated from the area. Dressing placed, and arm wrapped in ACE bandage.     Assessment:     The patient is a 65year old male with olecranon bursitis of left arm. Kickboxing likely caused traumatic olecranon bursitis, performed joint aspiration for fluid, appears serous no indication to place patient on medication or culture at this time. Will have patient reduce arm use for the next 1-2 weeks and then slowly continue daily activities.   High blood pressure  discussed with patient, he states his BP runs high when in doctors offices. He has never been diagnosed with HTN, upon rechecks usually BP goes down. Does not currently take medication for BP and is not interested at this time.    Plan:      1. Effusion of left olecranon bursa - Joint fluid aspiration formed - Continue to wrap area and keep it cleaned - Rest and moderate arm activity for 1-2 weeks

## 2016-01-31 NOTE — Progress Notes (Signed)
   Jerome Price  MRN: [id number] DOB: 11-28-1950  Subjective:  Pt presents to clinic with left elbow swollen without pain or irritation.  The patient started working out with group based kickboxing 4 month ago with no concerns. 4 days ago he was working out his elbows on the punching bag repeatedly, no pain or injury occurred in the class. 3 days ago noticed increased some swelling and warmth which has increased which promoted him to come to the office today. No difficulty or pain with range of motion. Denies recent fevers, chills, fatigue or myalgias.   Patient Active Problem List   Diagnosis Date Noted  . Coronary artery calcification seen on CAT scan 11/30/2015  . History of tobacco abuse 11/30/2015  . Malignant neoplasm of upper lobe of left lung ([place]) 12/13/2014  . Lung cancer ([place]) 12/10/2012    No current outpatient prescriptions on file prior to visit.   No current facility-administered medications on file prior to visit.    No Known Allergies  Review of Systems Objective:  BP 163/90 mmHg  Pulse 86  Temp(Src) 98.1 F (36.7 C) (Oral)  Resp 16  Ht 6' (1.829 m)  Wt 171 lb (77.565 kg)  BMI 23.19 kg/m2  SpO2 97%  Physical Exam  Constitutional: He is oriented to person, place, and time and well-developed, well-nourished, and in no distress.  HENT:  Head: Normocephalic and atraumatic.  Right Ear: External ear normal.  Left Ear: External ear normal.  Eyes: Conjunctivae are normal.  Neck: Normal range of motion.  Musculoskeletal:       Arms: Neurological: He is alert and oriented to person, place, and time. Gait normal.  Skin: Skin is warm and dry.  Psychiatric: Mood, memory, affect and judgment normal.   Procedure:   I was directly involved in the patient's care and actively participated during the procedure. Consent was obtained - discussed the risks associated with - betadine prep was done - Ethyl chloride spray for topical numbing - #18g needle used to aspirate  clear serosanginous fluid - 40cc was removed - pressure drsg was placed on the elbow and wound care was discussed with patient.  Assessment and Plan :  Elbow swelling, left  Effusion of left olecranon bursa  Elevated BP pt states he has white coat hypertension and he will keep monitoring this and seek care if it does not improve outside of the medical office  - wound care d/w pt - he will continue with ACE pressure drsg for at least 2 weeks to prevent re-accumulation of fluid.  F/u with any increase in erythema or warmth or if he starts to develop pain.  Windell Hummingbird PA-C  [facility] 01/31/2016 5:35 PM

## 2016-02-01 ENCOUNTER — Ambulatory Visit (INDEPENDENT_AMBULATORY_CARE_PROVIDER_SITE_OTHER): Payer: BLUE CROSS/BLUE SHIELD | Admitting: Physician Assistant

## 2016-02-01 VITALS — BP 128/70 | HR 84 | Temp 98.3°F | Resp 20 | Ht 72.0 in | Wt 172.4 lb

## 2016-02-01 DIAGNOSIS — M25422 Effusion, left elbow: Secondary | ICD-10-CM

## 2016-02-01 NOTE — Progress Notes (Signed)
Subjective:     Patient ID: IGNACIO LOWDER, male   DOB: 1951/06/15, 65 y.o.   MRN: [id number]  HPI The patient is a 65 year old with non significant PMH who presented yesterday with traumatic olecranon bursitis, we performed a bursa fluid aspiration and placed arm in ACE wrap.   Patient reports he left the ace wrap on all night and did not take it off until 5am. This morning he woke and noticed forearm swelling below the ace bandage. He removed the bandage, the swelling has decreased in his forearm. Still endorses no range of motion deficits, no tenderness or pain with swelling.   Review of Systems All pertinent ROS as above in HPI    Objective:   Physical Exam  Constitutional: He appears well-developed and well-nourished. No distress.  Cardiovascular: Intact distal pulses.   Musculoskeletal: Normal range of motion. He exhibits edema. He exhibits no tenderness.  Skin:  Effusion of the left elbow, swelling 8cm X 4cm area, slightly warm to touch, non-tender to palpation.     Procedure Note: Consent obtained. Area cleaned with iodine and sprayed with ethyl chloride. 18 gage needle with syringe injected into bursa, 20cc blood tinged serous fluid aspirated from area. Bandage and pressure dressing placed on top.    Assessment and Plan     Effusion of Olecranon Bursa - Fluid aspiration performed on patient - Educated him on need to continue to use arm and avoid too tight of bandage to cause increased forearm swelling.

## 2016-02-01 NOTE — Progress Notes (Signed)
   Jerome Price  MRN: [id number] DOB: 1951-01-13  Subjective:  Pt presents to clinic with returned swelling on his elbow.  He was seen yesterday with traumatic olecranon bursitis, we performed a bursa fluid aspiration and placed arm in ACE wrap.  Patient reports he left the ace wrap on all night and did not take it off until 5am. This morning he woke and noticed forearm swelling below the ace bandage. He removed the bandage, the swelling has decreased in his forearm. Still endorses no range of motion deficits, no tenderness or pain with swelling.  He would like the fluid removed again today.  Patient Active Problem List   Diagnosis Date Noted  . Coronary artery calcification seen on CAT scan 11/30/2015  . History of tobacco abuse 11/30/2015  . Malignant neoplasm of upper lobe of left lung ([place]) 12/13/2014  . Lung cancer ([place]) 12/10/2012    No current outpatient prescriptions on file prior to visit.   No current facility-administered medications on file prior to visit.    No Known Allergies  Review of Systems  Constitutional: Negative for fever and chills.  Musculoskeletal: Positive for joint swelling. Negative for arthralgias.   Objective:  BP 128/70 mmHg  Pulse 84  Temp(Src) 98.3 F (36.8 C) (Oral)  Resp 20  Ht 6' (1.829 m)  Wt 172 lb 6.4 oz (78.2 kg)  BMI 23.38 kg/m2  SpO2 97%  Physical Exam  Constitutional: He is oriented to person, place, and time and well-developed, well-nourished, and in no distress.  HENT:  Head: Normocephalic and atraumatic.  Right Ear: External ear normal.  Left Ear: External ear normal.  Eyes: Conjunctivae are normal.  Neck: Normal range of motion.  Pulmonary/Chest: Effort normal.  Musculoskeletal:       Left forearm: He exhibits swelling (olecranon bursa is swollen but no erythema and no TTP). He exhibits no tenderness and no bony tenderness.  Neurological: He is alert and oriented to person, place, and time. Gait normal.  Skin: Skin is  warm and dry.  Psychiatric: Mood, memory, affect and judgment normal.   Procedure:  Consent obtained - I was directly involved in the patient's care and actively participated during the procedure - betadine prep to the area - ethyl chloride for local numbing - #18 gauge needle to remove 20 CC serosanguinous fluid.  Ddrsg placed and pressure drsg.  Assessment and Plan :  Effusion of olecranon bursa, left  Procedure repeated again today - we discussed how to use/wear a pressure drsg - He will make sure he uses his hand and if he has any swelling to release some of the pressure on the drsg - he should wear the pressure drsg for 10-14 days to prevent reaccumulation of the fluid  Windell Hummingbird PA-C  [facility] 02/01/2016 10:55 AM

## 2016-02-01 NOTE — Patient Instructions (Addendum)
   Keep compression on it for the next 10-14 days - make sure to move the hand to prevent arm swelling - adding a little padding to the elbow may help with the pressure and not making the wrap tighter.   IF you received an x-ray today, you will receive an invoice from [facility]. Please contact [facility] at [phone] with questions or concerns regarding your invoice.   IF you received labwork today, you will receive an invoice from [employer]. Please contact [facility] at [phone] with questions or concerns regarding your invoice.   Our billing staff will not be able to assist you with questions regarding bills from these companies.  You will be contacted with the lab results as soon as they are available. The fastest way to get your results is to activate your My Chart account. Instructions are located on the last page of this paperwork. If you have not heard from [place] regarding the results in 2 weeks, please contact this office.

## 2016-02-09 ENCOUNTER — Ambulatory Visit (INDEPENDENT_AMBULATORY_CARE_PROVIDER_SITE_OTHER): Payer: BLUE CROSS/BLUE SHIELD | Admitting: Physician Assistant

## 2016-02-09 VITALS — BP 132/78 | HR 85 | Temp 97.9°F | Resp 18 | Ht 72.0 in | Wt 172.2 lb

## 2016-02-09 DIAGNOSIS — M25422 Effusion, left elbow: Secondary | ICD-10-CM

## 2016-02-09 NOTE — Progress Notes (Signed)
   Jerome Price  MRN: [id number] DOB: 08-20-1951  Subjective:  Pt presents to clinic because his olecranon has refilled with fluid.  He still has no pain or redness of his elbow.  He wore the compression dressing for a day - when he took it off the fluid came back and then did not go away when he put the drsg back on.  Patient Active Problem List   Diagnosis Date Noted  . Coronary artery calcification seen on CAT scan 11/30/2015  . History of tobacco abuse 11/30/2015  . Malignant neoplasm of upper lobe of left lung ([place]) 12/13/2014  . Lung cancer ([place]) 12/10/2012    No current outpatient prescriptions on file prior to visit.   No current facility-administered medications on file prior to visit.    No Known Allergies  Review of Systems  Constitutional: Negative for fever and chills.   Objective:  BP 132/78 mmHg  Pulse 85  Temp(Src) 97.9 F (36.6 C) (Oral)  Resp 18  Ht 6' (1.829 m)  Wt 172 lb 3.2 oz (78.109 kg)  BMI 23.35 kg/m2  SpO2 97%  Physical Exam  Constitutional: He is oriented to person, place, and time and well-developed, well-nourished, and in no distress.  HENT:  Head: Normocephalic and atraumatic.  Right Ear: External ear normal.  Left Ear: External ear normal.  Eyes: Conjunctivae are normal.  Neck: Normal range of motion.  Pulmonary/Chest: Effort normal.  Musculoskeletal:       Left elbow: He exhibits effusion (olecranon bursa is swollen without erythema or TTP). He exhibits normal range of motion and no swelling. No tenderness found.  Neurological: He is alert and oriented to person, place, and time. Gait normal.  Skin: Skin is warm and dry.  Psychiatric: Mood, memory, affect and judgment normal.   Procedure:  Consent obtained.  Pt understands the risks and benefits of the procedure.  Betadine prep to the area.  Ethyl chloride used.  #18g needle inserted into the bursa and about 35cc serosanginous fluid was aspirated.  During the procedure the needle  become plugged and it as removed - after the removed serous fluid was draining from the puncture site.  Assessment and Plan :  Effusion of olecranon bursa, left - pressure dressing placed on the wound - pt was again reminded to continue the pressure dressing for up to 2 weeks to stop re-accumulation of the fluid.  Suggested he get a compression sleeve for the area that might be more comfortable then an ACE or coban wrap.  He declined seeing hand PT for a specialized pressure brace at this time.  He was again instructed on when to RTC.  Windell Hummingbird PA-C  [facility] 02/09/2016 10:35 AM

## 2016-02-09 NOTE — Patient Instructions (Addendum)
  Elbow sleeve for compression for the next 2-3 weeks to prevent refill   IF you received an x-ray today, you will receive an invoice from [facility]. Please contact [facility] at [phone] with questions or concerns regarding your invoice.   IF you received labwork today, you will receive an invoice from [employer]. Please contact [facility] at [phone] with questions or concerns regarding your invoice.   Our billing staff will not be able to assist you with questions regarding bills from these companies.  You will be contacted with the lab results as soon as they are available. The fastest way to get your results is to activate your My Chart account. Instructions are located on the last page of this paperwork. If you have not heard from [place] regarding the results in 2 weeks, please contact this office.

## 2016-02-14 ENCOUNTER — Ambulatory Visit (INDEPENDENT_AMBULATORY_CARE_PROVIDER_SITE_OTHER): Payer: BLUE CROSS/BLUE SHIELD | Admitting: Family Medicine

## 2016-02-14 VITALS — BP 132/70 | HR 83 | Temp 97.9°F | Resp 17 | Ht 73.0 in | Wt 173.0 lb

## 2016-02-14 DIAGNOSIS — M25422 Effusion, left elbow: Secondary | ICD-10-CM | POA: Diagnosis not present

## 2016-02-14 NOTE — Addendum Note (Signed)
Addended by: Robyn Haber on: 02/14/2016 01:19 PM   Modules accepted: Orders

## 2016-02-14 NOTE — Patient Instructions (Signed)
     IF you received an x-ray today, you will receive an invoice from [facility]. Please contact [facility] at [phone] with questions or concerns regarding your invoice.   IF you received labwork today, you will receive an invoice from [facility]. Please contact [facility] at [phone] with questions or concerns regarding your invoice.   Our billing staff will not be able to assist you with questions regarding bills from these companies.  You will be contacted with the lab results as soon as they are available. The fastest way to get your results is to activate your My Chart account. Instructions are located on the last page of this paperwork. If you have not heard from us regarding the results in 2 weeks, please contact this office.      

## 2016-02-14 NOTE — Progress Notes (Addendum)
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 He's in because his left elbow has a recurrent olecranon bursitis with large fluid collection. He's had it drained twice but feels that he took off the elastic compression dressing to sewn in the fluid reaccumulated.  I explained that it may be that a cousin with trauma from kickboxing that he will have to have surgery regardless of how much compression but on the elbow.  Objective:BP 132/70 mmHg  Pulse 83  Temp(Src) 97.9 F (36.6 C) (Oral)  Resp 17  Ht '6\' 1"'$  (1.854 m)  Wt 173 lb (78.472 kg)  BMI 22.83 kg/m2  SpO2 96% After informed consent, the area on the left olecranon was prepped with isopropyl alcohol, then anesthetized with 1/2 mL of Marcaine. The olecranon bursa was then drained of 60 mL's of straw-colored fluid. There are no cough occasions and patient had full range of motion when he left.  A Coban compression dressing was performed and patient will be going to get elbow sleeve. I've asked him to call me back if fluid re-accumulates in which case he'll need orthopedic attention. Effusion of olecranon bursa, left - Plan: Synovial cell count + diff, w/ crystals    Signed, Robyn Haber M.D.

## 2016-02-15 LAB — SYNOVIAL CELL COUNT + DIFF, W/ CRYSTALS
Basophils, %: 0 %
Eosinophils-Synovial: 0 % (ref 0–2)
Lymphocytes-Synovial Fld: 15 % (ref 0–74)
Monocyte/Macrophage: 13 % (ref 0–69)
Neutrophil, Synovial: 72 % — ABNORMAL HIGH (ref 0–24)
Synoviocytes, %: 0 % (ref 0–15)
WBC, Synovial: 1905 cells/uL — ABNORMAL HIGH (ref <150)

## 2016-03-02 ENCOUNTER — Telehealth: Payer: Self-pay

## 2016-03-02 NOTE — Telephone Encounter (Signed)
IC pt, gave results of labs per Dr. Joseph Art.  Pt states he is much better with compression sleeve on elbow.  He will wean off it.

## 2016-07-25 ENCOUNTER — Ambulatory Visit (INDEPENDENT_AMBULATORY_CARE_PROVIDER_SITE_OTHER): Payer: BLUE CROSS/BLUE SHIELD

## 2016-07-25 ENCOUNTER — Ambulatory Visit (INDEPENDENT_AMBULATORY_CARE_PROVIDER_SITE_OTHER): Payer: BLUE CROSS/BLUE SHIELD | Admitting: Family Medicine

## 2016-07-25 VITALS — BP 122/72 | HR 76 | Temp 98.2°F | Resp 17 | Ht 72.5 in | Wt 165.0 lb

## 2016-07-25 DIAGNOSIS — J189 Pneumonia, unspecified organism: Secondary | ICD-10-CM | POA: Diagnosis not present

## 2016-07-25 DIAGNOSIS — Z85118 Personal history of other malignant neoplasm of bronchus and lung: Secondary | ICD-10-CM

## 2016-07-25 DIAGNOSIS — R61 Generalized hyperhidrosis: Secondary | ICD-10-CM

## 2016-07-25 LAB — CBC
HEMATOCRIT: 43.6 % (ref 38.5–50.0)
Hemoglobin: 15.1 g/dL (ref 13.2–17.1)
MCH: 30.8 pg (ref 27.0–33.0)
MCHC: 34.6 g/dL (ref 32.0–36.0)
MCV: 88.8 fL (ref 80.0–100.0)
MPV: 10.1 fL (ref 7.5–12.5)
PLATELETS: 297 10*3/uL (ref 140–400)
RBC: 4.91 MIL/uL (ref 4.20–5.80)
RDW: 12.9 % (ref 11.0–15.0)
WBC: 19.4 10*3/uL — AB (ref 3.8–10.8)

## 2016-07-25 LAB — COMPREHENSIVE METABOLIC PANEL
ALBUMIN: 3.8 g/dL (ref 3.6–5.1)
ALK PHOS: 56 U/L (ref 40–115)
ALT: 16 U/L (ref 9–46)
AST: 30 U/L (ref 10–35)
BILIRUBIN TOTAL: 0.6 mg/dL (ref 0.2–1.2)
BUN: 15 mg/dL (ref 7–25)
CALCIUM: 9.3 mg/dL (ref 8.6–10.3)
CO2: 27 mmol/L (ref 20–31)
Chloride: 97 mmol/L — ABNORMAL LOW (ref 98–110)
Creat: 1.03 mg/dL (ref 0.70–1.25)
Glucose, Bld: 130 mg/dL — ABNORMAL HIGH (ref 65–99)
Potassium: 4.5 mmol/L (ref 3.5–5.3)
Sodium: 136 mmol/L (ref 135–146)
TOTAL PROTEIN: 6.9 g/dL (ref 6.1–8.1)

## 2016-07-25 LAB — HIV ANTIBODY (ROUTINE TESTING W REFLEX): HIV: NONREACTIVE

## 2016-07-25 LAB — TSH: TSH: 3.46 m[IU]/L (ref 0.40–4.50)

## 2016-07-25 MED ORDER — AZITHROMYCIN 250 MG PO TABS: ORAL_TABLET | ORAL | 0 refills | Status: DC

## 2016-07-25 NOTE — Patient Instructions (Addendum)
Your chest xray did look like a pneumonia.    Take 2 pills today then 1 pill daily po after that for the azithromycin.  This is an antibiotic.  Come back in about 4 - 6 weeks for a chest xray repeat.      IF you received an x-ray today, you will receive an invoice from [facility]. Please contact [facility] at [phone] with questions or concerns regarding your invoice.   IF you received labwork today, you will receive an invoice from [employer]. Please contact [facility] at [phone] with questions or concerns regarding your invoice.   Our billing staff will not be able to assist you with questions regarding bills from these companies.  You will be contacted with the lab results as soon as they are available. The fastest way to get your results is to activate your My Chart account. Instructions are located on the last page of this paperwork. If you have not heard from [place] regarding the results in 2 weeks, please contact this office.

## 2016-07-25 NOTE — Progress Notes (Signed)
   Jerome Price is a 65 y.o. male who presents to [facility] today for night sweats:  1.  Night sweats:  States this occurs roughtly yearly for as long as he can remember..  Last happened last week, Thursday/Friday of last week.  Has had some general malaise since then as well. Night sweats have persisted. He did have one or 2 days over the weekend where he did not have any night sweats. Feels febrile at night. Also with cough which is sometimes dry, sometimes productive.  He is a former smoker and stopped smoking about 18 years ago in 1999/2000. He has history of lung cancer treated with resection upper lobe and chemotherapy in 2008. He has felt well since then.  ROS as above.  Pertinently, no chest pain, palpitations, SOB, Fever, Chills, Abd pain, N/V/D.   PMH reviewed. Patient is a nonsmoker.   Past Medical History:  Diagnosis Date  . Cancer ([place]) lung ca    dx'd 2008   Past Surgical History:  Procedure Laterality Date  . LUNG REMOVAL, PARTIAL      Medications reviewed. No current outpatient prescriptions on file.   No current facility-administered medications for this visit.      Physical Exam:  BP 122/72 (BP Location: Right Arm, Patient Position: Sitting, Cuff Size: Normal)   Pulse 76   Temp 98.2 F (36.8 C) (Oral)   Resp 17   Ht 6' 0.5" (1.842 m)   Wt 165 lb (74.8 kg)   SpO2 96%   BMI 22.07 kg/m  Gen:  Alert, cooperative patient who appears stated age in no acute distress.  Vital signs reviewed. HEENT: EOMI,  MMM Pulm:  Normal work of breathing. Lungs sounds are louder on the left which possibly could be secondary to consolidation or secondary to his prior partial lobectomy   Cardiac:  Regular rate and rhythm  Abd:  Soft/nondistended/nontender.  Good bowel sounds throughout all four quadrants.  No masses noted.  Exts: Non edematous BL  LE, warm and well perfused.  Neuro:  A&O x 4.    Assessment and Plan:  1.  Pneumonia: - Most likely  diagnosis of community-acquired pneumonia. -No recent healthcare exposure. -However he is a former smoker and has had a history of lung cancer. -Retreated today for community-acquired pneumonia with azithromycin. -Needs return in 4-6 weeks to evaluate for radiographic resolution of the opacity on the left side of his lung. If the opacity still present would recommend CT at that time to further evaluate for any malignancy.

## 2016-07-26 LAB — RPR

## 2016-07-26 LAB — HEPATITIS PANEL, ACUTE
HCV Ab: NEGATIVE
HEP A IGM: NONREACTIVE
HEP B C IGM: NONREACTIVE
Hepatitis B Surface Ag: NEGATIVE

## 2016-07-27 ENCOUNTER — Encounter: Payer: Self-pay | Admitting: Family Medicine

## 2016-09-12 ENCOUNTER — Ambulatory Visit (INDEPENDENT_AMBULATORY_CARE_PROVIDER_SITE_OTHER): Payer: BLUE CROSS/BLUE SHIELD | Admitting: Family Medicine

## 2016-09-12 ENCOUNTER — Encounter: Payer: Self-pay | Admitting: Gastroenterology

## 2016-09-12 ENCOUNTER — Ambulatory Visit (INDEPENDENT_AMBULATORY_CARE_PROVIDER_SITE_OTHER): Payer: BLUE CROSS/BLUE SHIELD

## 2016-09-12 VITALS — BP 142/84 | HR 91 | Temp 98.3°F | Resp 16 | Wt 172.8 lb

## 2016-09-12 DIAGNOSIS — J189 Pneumonia, unspecified organism: Secondary | ICD-10-CM | POA: Diagnosis not present

## 2016-09-12 DIAGNOSIS — R918 Other nonspecific abnormal finding of lung field: Secondary | ICD-10-CM | POA: Diagnosis not present

## 2016-09-12 DIAGNOSIS — Z1211 Encounter for screening for malignant neoplasm of colon: Secondary | ICD-10-CM

## 2016-09-12 DIAGNOSIS — Z85118 Personal history of other malignant neoplasm of bronchus and lung: Secondary | ICD-10-CM

## 2016-09-12 LAB — POCT CBC
GRANULOCYTE PERCENT: 47.4 % (ref 37–80)
HCT, POC: 38.6 % — AB (ref 43.5–53.7)
HEMOGLOBIN: 14.1 g/dL (ref 14.1–18.1)
Lymph, poc: 2.2 (ref 0.6–3.4)
MCH: 31.3 pg — AB (ref 27–31.2)
MCHC: 36.5 g/dL — AB (ref 31.8–35.4)
MCV: 85.9 fL (ref 80–97)
MID (cbc): 0.5 (ref 0–0.9)
MPV: 6.7 fL (ref 0–99.8)
PLATELET COUNT, POC: 219 10*3/uL (ref 142–424)
POC Granulocyte: 2.4 (ref 2–6.9)
POC LYMPH PERCENT: 42.8 %L (ref 10–50)
POC MID %: 9.8 % (ref 0–12)
RBC: 4.49 M/uL — AB (ref 4.69–6.13)
RDW, POC: 12.9 %
WBC: 5.1 10*3/uL (ref 4.6–10.2)

## 2016-09-12 NOTE — Progress Notes (Signed)
    Subjective: CC: pneumonia follow up, colon cancer screening HPI: Jerome Price is a 65 y.o. male presenting to clinic today for follow up. Concerns today include:  1. Pneumonia Patient seen 07/25/2016 and diagnosed with pneumonia in the left mid lung.  He was discharged with Zpak.  Patient reports that his symptoms have resolved.  He is a former smoker with a pertinent medical history of lung cancer that was resected/ chemotherapy in 2008.  He will need a repeat chest xray today and possibly CT chest if persistent opacity..  2. Colon cancer screening Patient reports no history of colon, ovarian, breast cancer in his family.  He denies hematochezia, melena, nausea, vomiting, unexpected weight loss.  Has never had a colonoscopy.  Social History Reviewed: former smoker. FamHx and MedHx reviewed.  Please see EMR. Health Maintenance: colonoscopy due  ROS: Per HPI  Objective: Office vital signs reviewed. BP (!) 142/84 (BP Location: Right Arm, Cuff Size: Large)   Pulse 91   Temp 98.3 F (36.8 C)   Resp 16   Wt 172 lb 12.8 oz (78.4 kg)   SpO2 97%   BMI 23.11 kg/m   Physical Examination:  General: Awake, alert, well nourished,well appearing male, No acute distress Neck: no cervical LAD Cardio: regular rate and rhythm, S1S2 heard, no murmurs appreciated Pulm: clear to auscultation bilaterally, no wheezes, rhonchi or rales, normal WOB on room air  Assessment/ Plan: 65 y.o. male   1. Community acquired pneumonia of left lung, unspecified part of lung.  Opacity appreciated on mid left lung on 9/27.  CXR repeated to evaluate resolution of left lung opacity.  CXR was showed improvement in left lung opacity but continued to be abnormal, particularly at the left lung base.  Given significant elevation in WBC last visit and history of malignancy, CBC repeated today and was within normal limits, with resolved leukocytosis. - POCT CBC - DG Chest 2 View  2. Screening for colon cancer.   Never had colonoscopy before.  Has history of left upper lobe lung cancer '08.  No family history of colon cancer.  No red flags. - Ambulatory referral to Gastroenterology  3. Opacity of lung on imaging study, CT chest was ordered, as there was persistent irregularities at the left lung base and given history of lung cancer in 2008.  - CT chest with contrast scheduled for 09/14/16 at 3:45 at [facility].   - Patient informed of appt and need for NPO 4 hours prior. - DG Chest 2 View  Follow up prn  This patient was discussed with Dr Mingo Amber, who agrees with my assessment and plan.  Janora Norlander, DO PGY-3, [facility]

## 2016-09-12 NOTE — Patient Instructions (Addendum)
I have placed a referral to gastroenterology for colonoscopy.  You should receive a call with an appointment soon.  If you do no hear anything in the next week, please give our office a call.  Your repeat CBC was normal today.  Your repeat Chest xray was still irregular.  Therefore, a CT scan of your chest has been ordered and is scheduled Friday, 09/14/16, at 3:45pm.  Do not eat or drink anything after 11:45am that day.  I will call you with the results of this once it is available. I am glad to hear that you are feeling better.    IF you received an x-ray today, you will receive an invoice from [facility]. Please contact [facility] at [phone] with questions or concerns regarding your invoice.   IF you received labwork today, you will receive an invoice from [employer]. Please contact [facility] at [phone] with questions or concerns regarding your invoice.   Our billing staff will not be able to assist you with questions regarding bills from these companies.  You will be contacted with the lab results as soon as they are available. The fastest way to get your results is to activate your My Chart account. Instructions are located on the last page of this paperwork. If you have not heard from [place] regarding the results in 2 weeks, please contact this office.

## 2016-09-13 ENCOUNTER — Other Ambulatory Visit: Payer: Self-pay | Admitting: Emergency Medicine

## 2016-09-13 ENCOUNTER — Other Ambulatory Visit: Payer: Self-pay | Admitting: Family Medicine

## 2016-09-13 DIAGNOSIS — Z130 Encounter for screening for diseases of the blood and blood-forming organs and certain disorders involving the immune mechanism: Secondary | ICD-10-CM

## 2016-09-13 DIAGNOSIS — Z13228 Encounter for screening for other metabolic disorders: Principal | ICD-10-CM

## 2016-09-13 DIAGNOSIS — R9389 Abnormal findings on diagnostic imaging of other specified body structures: Secondary | ICD-10-CM

## 2016-09-13 DIAGNOSIS — Z1329 Encounter for screening for other suspected endocrine disorder: Principal | ICD-10-CM

## 2016-09-14 ENCOUNTER — Ambulatory Visit (HOSPITAL_COMMUNITY)
Admission: RE | Admit: 2016-09-14 | Discharge: 2016-09-14 | Disposition: A | Discharge status: Home / Self Care | Payer: BLUE CROSS/BLUE SHIELD | Source: Ambulatory Visit | Attending: Family Medicine | Admitting: Family Medicine

## 2016-09-14 DIAGNOSIS — I700 Atherosclerosis of aorta: Secondary | ICD-10-CM | POA: Diagnosis not present

## 2016-09-14 DIAGNOSIS — J8410 Pulmonary fibrosis, unspecified: Secondary | ICD-10-CM | POA: Diagnosis not present

## 2016-09-14 DIAGNOSIS — R918 Other nonspecific abnormal finding of lung field: Secondary | ICD-10-CM | POA: Diagnosis not present

## 2016-09-14 DIAGNOSIS — Z85118 Personal history of other malignant neoplasm of bronchus and lung: Secondary | ICD-10-CM | POA: Diagnosis not present

## 2016-09-14 DIAGNOSIS — Z902 Acquired absence of lung [part of]: Secondary | ICD-10-CM | POA: Insufficient documentation

## 2016-09-14 DIAGNOSIS — I2510 Atherosclerotic heart disease of native coronary artery without angina pectoris: Secondary | ICD-10-CM | POA: Diagnosis not present

## 2016-09-14 DIAGNOSIS — J439 Emphysema, unspecified: Secondary | ICD-10-CM | POA: Diagnosis not present

## 2016-09-14 DIAGNOSIS — Z9889 Other specified postprocedural states: Secondary | ICD-10-CM | POA: Diagnosis not present

## 2016-09-14 LAB — POCT I-STAT CREATININE: Creatinine, Ser: 1 mg/dL (ref 0.61–1.24)

## 2016-09-14 MED ORDER — IOPAMIDOL (ISOVUE-300) INJECTION 61%: 75.0000 mL | Freq: Once | INTRAVENOUS | Status: DC | PRN

## 2016-09-19 ENCOUNTER — Telehealth: Payer: Self-pay | Admitting: Family Medicine

## 2016-09-19 NOTE — Telephone Encounter (Signed)
-----   Message from Janora Norlander, DO sent at 09/15/2016  8:34 PM EST ----- Regarding: FW: ----- Message ----- From: Interface, Rad Results In Sent: 09/14/2016   8:34 PM To: Janora Norlander, DO

## 2016-09-19 NOTE — Telephone Encounter (Signed)
Called but had to leave message per patient.  Discussed results of his CT scan. His pneumonia had cleared. However he did have an area of groundglass opacity in his right upper lobe.  The radiologist recommendation was to have this repeated in 3-6 months. I did leave this as a Advertising account executive. I told him the other option be to go back and see his oncologist for a recommendation.  I did tell him to call us to let us know which would he would prefer: either waiting and repeating his CT scan in 3 months,  or going ahead to see his oncologist for further recommendations.   Please let him ask which he would prefer when he returns the call.

## 2016-10-12 ENCOUNTER — Telehealth: Payer: Self-pay | Admitting: Family Medicine

## 2016-10-12 NOTE — Telephone Encounter (Signed)
Pt calling stating that he would like for Dr Mingo Amber to send a message to Dr Allegra Lai at the cancer to review his CT that was done on 09-14-16 before he make any more plans he wants his opion on the CT that you had ordered

## 2016-10-16 NOTE — Telephone Encounter (Signed)
Called and spoke with patient after corresponding with Dr. Julien Nordmann.  Plan is to repeat the CT scan within 6 months, as per radiologist recommendations, which would be May 2018.  Patient states he will contact oncologist to schedule repeat CT scan.  I asked he contact us again if he has any issues.  Expressed appreciation.

## 2016-10-31 ENCOUNTER — Encounter: Payer: BLUE CROSS/BLUE SHIELD | Admitting: Gastroenterology

## 2017-08-26 ENCOUNTER — Encounter: Payer: Self-pay | Admitting: Gastroenterology

## 2017-09-19 ENCOUNTER — Ambulatory Visit (AMBULATORY_SURGERY_CENTER): Payer: Self-pay

## 2017-09-19 ENCOUNTER — Other Ambulatory Visit: Payer: Self-pay

## 2017-09-19 VITALS — Ht 73.0 in | Wt 170.4 lb

## 2017-09-19 DIAGNOSIS — Z1211 Encounter for screening for malignant neoplasm of colon: Secondary | ICD-10-CM

## 2017-09-19 MED ORDER — NA SULFATE-K SULFATE-MG SULF 17.5-3.13-1.6 GM/177ML PO SOLN: 1.0000 | Freq: Once | ORAL | 0 refills | Status: AC

## 2017-09-19 NOTE — Progress Notes (Signed)
Denies allergies to eggs or soy products. Denies complication of anesthesia or sedation. Denies use of weight loss medication. Denies use of O2.   Emmi instructions declined.  

## 2017-10-01 ENCOUNTER — Encounter: Payer: Self-pay | Admitting: Gastroenterology

## 2017-10-10 ENCOUNTER — Other Ambulatory Visit: Payer: Self-pay

## 2017-10-10 ENCOUNTER — Encounter: Payer: Self-pay | Admitting: Gastroenterology

## 2017-10-10 ENCOUNTER — Ambulatory Visit (AMBULATORY_SURGERY_CENTER): Payer: Medicare HMO | Admitting: Gastroenterology

## 2017-10-10 VITALS — BP 105/65 | HR 67 | Temp 97.0°F | Resp 10 | Ht 73.0 in | Wt 172.0 lb

## 2017-10-10 DIAGNOSIS — Z1211 Encounter for screening for malignant neoplasm of colon: Secondary | ICD-10-CM | POA: Diagnosis present

## 2017-10-10 DIAGNOSIS — D123 Benign neoplasm of transverse colon: Secondary | ICD-10-CM | POA: Diagnosis not present

## 2017-10-10 MED ORDER — SODIUM CHLORIDE 0.9 % IV SOLN: 500.0000 mL | INTRAVENOUS | Status: DC

## 2017-10-10 NOTE — Op Note (Signed)
[facility] Patient Name: Jerome Price Procedure Date: 10/10/2017 8:05 AM MRN: [id number] Endoscopist: Mauri Pole , MD Age: 66 Referring MD:  Date of Birth: 13-Jun-1951 Gender: Male Account #: [id number] Procedure:                Colonoscopy Indications:              Screening for colorectal malignant neoplasm Medicines:                Monitored Anesthesia Care Procedure:                Pre-Anesthesia Assessment:                           - Prior to the procedure, a History and Physical                            was performed, and patient medications and                            allergies were reviewed. The patient's tolerance of                            previous anesthesia was also reviewed. The risks                            and benefits of the procedure and the sedation                            options and risks were discussed with the patient.                            All questions were answered, and informed consent                            was obtained. Prior Anticoagulants: The patient has                            taken no previous anticoagulant or antiplatelet                            agents. ASA Grade Assessment: I - A normal, healthy                            patient. After reviewing the risks and benefits,                            the patient was deemed in satisfactory condition to                            undergo the procedure.                           After obtaining informed consent, the colonoscope  was passed under direct vision. Throughout the                            procedure, the patient's blood pressure, pulse, and                            oxygen saturations were monitored continuously. The                            Colonoscope was introduced through the anus and                            advanced to the the cecum, identified by                            appendiceal orifice and ileocecal  valve. The                            colonoscopy was performed without difficulty. The                            patient tolerated the procedure well. The quality                            of the bowel preparation was excellent. The                            ileocecal valve, appendiceal orifice, and rectum                            were photographed. Scope In: 8:11:22 AM Scope Out: 8:24:36 AM Scope Withdrawal Time: 0 hours 8 minutes 52 seconds  Total Procedure Duration: 0 hours 13 minutes 14 seconds  Findings:                 The perianal and digital rectal examinations were                            normal.                           A 5 mm polyp was found in the transverse colon. The                            polyp was sessile. The polyp was removed with a                            cold snare. Resection and retrieval were complete.                           Multiple small and large-mouthed diverticula were                            found in the sigmoid colon and descending colon.  There was evidence of an impacted diverticulum.                            There was no evidence of diverticular bleeding.                           A patchy area of mildly friable mucosa with contact                            bleeding was found in the cecum and ascending                            colon, ?barotrauma. Complications:            No immediate complications. Estimated Blood Loss:     Estimated blood loss was minimal. Impression:               - One 5 mm polyp in the transverse colon, removed                            with a cold snare. Resected and retrieved.                           - Moderate diverticulosis in the sigmoid colon and                            in the descending colon. There was evidence of an                            impacted diverticulum. There was no evidence of                            diverticular bleeding.                           -  Friability with contact bleeding in the cecum. Recommendation:           - Patient has a contact number available for                            emergencies. The signs and symptoms of potential                            delayed complications were discussed with the                            patient. Return to normal activities tomorrow.                            Written discharge instructions were provided to the                            patient.                           -  Resume previous diet.                           - Continue present medications.                           - Await pathology results.                           - Repeat colonoscopy in 5-10 years for surveillance                            based on pathology results. Mauri Pole, MD 10/10/2017 8:30:29 AM This report has been signed electronically.

## 2017-10-10 NOTE — Progress Notes (Signed)
Pt's states no medical or surgical changes since previsit or office visit. 

## 2017-10-10 NOTE — Progress Notes (Signed)
Called to room to assist during endoscopic procedure.  Patient ID and intended procedure confirmed with present staff. Received instructions for my participation in the procedure from the performing physician.  

## 2017-10-10 NOTE — Progress Notes (Signed)
Patient instructed to stay on a soft diet until Monday.

## 2017-10-10 NOTE — Patient Instructions (Signed)
Discharge instructions given. Handouts on polyps and diverticulosis. Resume previous medications. YOU HAD AN ENDOSCOPIC PROCEDURE TODAY AT THE [facility]:   Refer to the procedure report that was given to you for any specific questions about what was found during the examination.  If the procedure report does not answer your questions, please call your gastroenterologist to clarify.  If you requested that your care partner not be given the details of your procedure findings, then the procedure report has been included in a sealed envelope for you to review at your convenience later.  YOU SHOULD EXPECT: Some feelings of bloating in the abdomen. Passage of more gas than usual.  Walking can help get rid of the air that was put into your GI tract during the procedure and reduce the bloating. If you had a lower endoscopy (such as a colonoscopy or flexible sigmoidoscopy) you may notice spotting of blood in your stool or on the toilet paper. If you underwent a bowel prep for your procedure, you may not have a normal bowel movement for a few days.  Please Note:  You might notice some irritation and congestion in your nose or some drainage.  This is from the oxygen used during your procedure.  There is no need for concern and it should clear up in a day or so.  SYMPTOMS TO REPORT IMMEDIATELY:   Following lower endoscopy (colonoscopy or flexible sigmoidoscopy):  Excessive amounts of blood in the stool  Significant tenderness or worsening of abdominal pains  Swelling of the abdomen that is new, acute  Fever of 100F or higher   For urgent or emergent issues, a gastroenterologist can be reached at any hour by calling [phone].   DIET:  We do recommend a small meal at first, but then you may proceed to your regular diet.  Drink plenty of fluids but you should avoid alcoholic beverages for 24 hours.  ACTIVITY:  You should plan to take it easy for the rest of today and you should NOT  DRIVE or use heavy machinery until tomorrow (because of the sedation medicines used during the test).    FOLLOW UP: Our staff will call the number listed on your records the next business day following your procedure to check on you and address any questions or concerns that you may have regarding the information given to you following your procedure. If we do not reach you, we will leave a message.  However, if you are feeling well and you are not experiencing any problems, there is no need to return our call.  We will assume that you have returned to your regular daily activities without incident.  If any biopsies were taken you will be contacted by phone or by letter within the next 1-3 weeks.  Please call us at [phone] if you have not heard about the biopsies in 3 weeks.    SIGNATURES/CONFIDENTIALITY: You and/or your care partner have signed paperwork which will be entered into your electronic medical record.  These signatures attest to the fact that that the information above on your After Visit Summary has been reviewed and is understood.  Full responsibility of the confidentiality of this discharge information lies with you and/or your care-partner. 

## 2017-10-11 ENCOUNTER — Telehealth: Payer: Self-pay | Admitting: *Deleted

## 2017-10-11 ENCOUNTER — Telehealth: Payer: Self-pay

## 2017-10-11 NOTE — Telephone Encounter (Signed)
  Follow up Call-  Call back number 10/10/2017  Post procedure Call Back phone  # [phone]  Permission to leave phone message Yes  Some recent data might be hidden     Patient questions:  Message left to call us if necessary. Second call.

## 2017-10-11 NOTE — Telephone Encounter (Signed)
Attem[ted to reach pt. With follow up call following endoscopic procedure 10/10/2017.  LM on pt. Ans. Machine.   Will try to reach pt. Again later today.

## 2017-10-16 ENCOUNTER — Encounter: Payer: Self-pay | Admitting: Gastroenterology

## 2017-10-30 ENCOUNTER — Ambulatory Visit: Payer: Self-pay | Admitting: *Deleted

## 2017-10-30 NOTE — Telephone Encounter (Signed)
Pt states he may have injured right ankle at the gym this morning around 7am. Pt unsure of how he make have injured the ankle and states he did not notice pain to the area until 3pm today while playing golf. Pt denies any redness or swelling to the ankle. Pt states he is experiencing some pain with standing, but denies any numbness or tingling and still has range of motion to foot and ankle. Pt requested to make appt for tomorrow for evaluation. Pt given home care advice until seen at appt. Pt also advised to to ER/Urgent Care if pain worsens during the night or if other symptoms develop before seen at appt tomorrow.   Reason for Disposition . Caused by overuse from recent vigorous activity (e.g., vigorous activity, running)  Answer Assessment - Initial Assessment Questions 1. ONSET: "When did the pain start?"      Today at 3pm 2. LOCATION: "Where is the pain located?"      Left ankle 3. PAIN: "How bad is the pain?"    (Scale 1-10; or mild, moderate, severe)  - MILD (1-3): doesn't interfere with normal activities   - MODERATE (4-7): interferes with normal activities (e.g., work or school) or awakens from sleep, limping   - SEVERE (8-10): excruciating pain, unable to do any normal activities, unable to walk      Pain 4-5 4. WORK OR EXERCISE: "Has there been any recent work or exercise that involved this part of the body?"      Exercise at the gym, may have did something at the gym 5. CAUSE: "What do you think is causing the ankle pain?"    Possibly injured at the gym around 7 6. OTHER SYMPTOMS: "Do you have any other symptoms?" (e.g., calf pain, rash, fever, swelling)     No  Protocols used: ANKLE PAIN-A-AH

## 2017-10-31 ENCOUNTER — Ambulatory Visit: Payer: BLUE CROSS/BLUE SHIELD | Admitting: Physician Assistant

## 2018-02-17 DIAGNOSIS — H524 Presbyopia: Secondary | ICD-10-CM | POA: Diagnosis not present

## 2018-02-17 DIAGNOSIS — H53031 Strabismic amblyopia, right eye: Secondary | ICD-10-CM | POA: Diagnosis not present

## 2018-02-17 DIAGNOSIS — H538 Other visual disturbances: Secondary | ICD-10-CM | POA: Diagnosis not present

## 2018-02-17 DIAGNOSIS — H2511 Age-related nuclear cataract, right eye: Secondary | ICD-10-CM | POA: Diagnosis not present

## 2018-05-28 ENCOUNTER — Other Ambulatory Visit: Payer: Self-pay

## 2018-05-28 ENCOUNTER — Ambulatory Visit (INDEPENDENT_AMBULATORY_CARE_PROVIDER_SITE_OTHER): Payer: Medicare HMO | Admitting: Emergency Medicine

## 2018-05-28 ENCOUNTER — Ambulatory Visit (INDEPENDENT_AMBULATORY_CARE_PROVIDER_SITE_OTHER): Payer: Medicare HMO

## 2018-05-28 ENCOUNTER — Encounter: Payer: Self-pay | Admitting: Emergency Medicine

## 2018-05-28 VITALS — BP 184/75 | HR 72 | Temp 98.2°F | Resp 16 | Ht 72.0 in | Wt 168.9 lb

## 2018-05-28 DIAGNOSIS — Z23 Encounter for immunization: Secondary | ICD-10-CM | POA: Diagnosis not present

## 2018-05-28 DIAGNOSIS — S2241XA Multiple fractures of ribs, right side, initial encounter for closed fracture: Secondary | ICD-10-CM

## 2018-05-28 DIAGNOSIS — S20211A Contusion of right front wall of thorax, initial encounter: Secondary | ICD-10-CM

## 2018-05-28 DIAGNOSIS — S40811A Abrasion of right upper arm, initial encounter: Secondary | ICD-10-CM

## 2018-05-28 DIAGNOSIS — S298XXA Other specified injuries of thorax, initial encounter: Secondary | ICD-10-CM

## 2018-05-28 NOTE — Progress Notes (Signed)
Jerome Price 67 y.o.   Chief Complaint  Patient presents with  . Fall    injury to RIGHT ELBOW and right side ribs x 6 days ago    HISTORY OF PRESENT ILLNESS: This is a 67 y.o. male tripped and fell 6 days ago.  Injured right arm sustaining abrasion and also right rib cage.  Denies difficulty breathing, cough, or hemoptysis.  Denies head injury.  No other complaints.  Pain is sharp and constant, worse with movement, but not as bad as several days ago.  Slowly improving.  No associated symptomatology.  Denies loss of consciousness when the injury happened. Ex-smoker with a history of lung cancer status post left upper lobe lobectomy. HPI   Prior to Admission medications   Not on File    No Known Allergies  Patient Active Problem List   Diagnosis Date Noted  . Coronary artery calcification seen on CAT scan 11/30/2015  . History of tobacco abuse 11/30/2015  . 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 Lung cancer ([place]) 12/10/2012    Past Medical History:  Diagnosis Date  . Cancer ([place]) lung ca    dx'd 2008    Past Surgical History:  Procedure Laterality Date  . EYE MUSCLE SURGERY Right   . LUNG REMOVAL, PARTIAL      Social History   Socioeconomic History  . Marital status: Married    Spouse name: Not on file  . Number of children: Not on file  . Years of education: Not on file  . Highest education level: Not on file  Occupational History  . Not on file  Social Needs  . Financial resource strain: Not on file  . Food insecurity:    Worry: Not on file    Inability: Not on file  . Transportation needs:    Medical: Not on file    Non-medical: Not on file  Tobacco Use  . Smoking status: Former Smoker    Last attempt to quit: 11/05/1998    Years since quitting: 19.5  . Smokeless tobacco: Never Used  Substance and Sexual Activity  . Alcohol use: Yes    Alcohol/week: 0.0 oz    Comment: occasional beer  . Drug use: No  . Sexual activity:  Not on file  Lifestyle  . Physical activity:    Days per week: Not on file    Minutes per session: Not on file  . Stress: Not on file  Relationships  . Social connections:    Talks on phone: Not on file    Gets together: Not on file    Attends religious service: Not on file    Active member of club or organization: Not on file    Attends meetings of clubs or organizations: Not on file    Relationship status: Not on file  . Intimate partner violence:    Fear of current or ex partner: Not on file    Emotionally abused: Not on file    Physically abused: Not on file    Forced sexual activity: Not on file  Other Topics Concern  . Not on file  Social History Narrative  . Not on file    Family History  Problem Relation Age of Onset  . Lung cancer Father   . Heart attack Neg Hx   . Hypertension Neg Hx   . Stroke Neg Hx   . Colon cancer Neg Hx   . Esophageal cancer Neg Hx   .  Pancreatic cancer Neg Hx   . Rectal cancer Neg Hx   . Stomach cancer Neg Hx      Review of Systems  Constitutional: Negative.  Negative for chills and fever.  HENT: Negative.   Eyes: Negative.   Respiratory: Negative.  Negative for cough and shortness of breath.   Cardiovascular: Negative.  Negative for chest pain and palpitations.  Gastrointestinal: Negative.  Negative for abdominal pain, nausea and vomiting.  Genitourinary: Negative.  Negative for hematuria.  Musculoskeletal: Negative.   Skin:       Positive abrasion  Neurological: Negative.  Negative for dizziness, sensory change, focal weakness, loss of consciousness and headaches.  Endo/Heme/Allergies: Negative.   All other systems reviewed and are negative.   Vitals:   05/28/18 0814  BP: (!) 184/75  Pulse: 72  Resp: 16  Temp: 98.2 F (36.8 C)  SpO2: 95%    Physical Exam  Constitutional: He is oriented to person, place, and time. He appears well-developed and well-nourished.  HENT:  Head: Normocephalic and atraumatic.  Right Ear:  External ear normal.  Left Ear: External ear normal.  Nose: Nose normal.  Mouth/Throat: Oropharynx is clear and moist.  Eyes: Pupils are equal, round, and reactive to light. Conjunctivae and EOM are normal.  Neck: Normal range of motion. Neck supple. No JVD present.  Cardiovascular: Normal rate, regular rhythm and normal heart sounds.  Pulmonary/Chest: Effort normal and breath sounds normal. No respiratory distress. He has no wheezes. He has no rales. He exhibits tenderness (Mild right lower lateral rib tenderness).  Abdominal: Soft. There is no tenderness.  Musculoskeletal: Normal range of motion.  Right upper extremity: Full range of motion.  No fracture.  Positive abrasion.  Lymphadenopathy:    He has no cervical adenopathy.  Neurological: He is alert and oriented to person, place, and time. No sensory deficit. He exhibits normal muscle tone. Coordination normal.  Skin: Skin is warm and dry. Capillary refill takes less than 2 seconds. No rash noted.  Right upper arm: Positive abrasion.  No infection.  Psychiatric: He has a normal mood and affect. His behavior is normal.  Vitals reviewed.  Dg Ribs Unilateral W/chest Right  Result Date: 05/28/2018 CLINICAL DATA:  Injury 1 week prior with persistent pain EXAM: RIGHT RIBS AND CHEST - 3+ VIEW COMPARISON:  Chest radiograph September 12, 2016 and chest CT September 14, 2016 FINDINGS: Frontal chest as well as oblique and cone-down lower rib images were obtained. There are areas of scarring in the left lower lobe region. There is no edema or consolidation. Heart size and pulmonary vascularity are normal. No adenopathy. There is aortic atherosclerosis. There is evidence of an old healed fracture of the anterior right eleventh rib. There are recent slightly displaced fractures of the lateral right fourth and fifth ribs. No appreciable pneumothorax or pleural effusion. IMPRESSION: Mildly displaced fractures of the lateral right fourth and fifth ribs without  appreciable pneumothorax or pleural effusion. Old healed fracture anterior right eleventh rib. Areas of scarring on the left. No edema or consolidation. Heart size normal. There is aortic atherosclerosis. Aortic Atherosclerosis (ICD10-I70.0). Electronically Signed   By: Lowella Grip III M.D.   On: 05/28/2018 08:28    Multiple closed fractures of ribs of right side Clinically stable.  Tolerating pain well.  No red flag signs or symptoms.  No complications.  No pneumothorax.   ASSESSMENT & PLAN: Mandell was seen today for fall.  Diagnoses and all orders for this visit:  Contusion of rib  on right side, initial encounter -     DG Ribs Unilateral W/Chest Right; Future  Abrasion of right upper extremity, initial encounter -     Tdap vaccine greater than or equal to 7yo IM  Closed fracture of multiple ribs of right side, initial encounter  Need for prophylactic vaccination against Streptococcus pneumoniae (pneumococcus) -     Pneumococcal conjugate vaccine 13-valent IM     Patient Instructions       IF you received an x-ray today, you will receive an invoice from [facility]. Please contact [facility] at [phone] with questions or concerns regarding your invoice.   IF you received labwork today, you will receive an invoice from [place]. Please contact [facility] at [phone] with questions or concerns regarding your invoice.   Our billing staff will not be able to assist you with questions regarding bills from these companies.  You will be contacted with the lab results as soon as they are available. The fastest way to get your results is to activate your My Chart account. Instructions are located on the last page of this paperwork. If you have not heard from [place] regarding the results in 2 weeks, please contact this office.     Rib Fracture A rib fracture is a break or crack in one of the bones of the ribs. The ribs are like a cage that goes around your  upper chest. A broken or cracked rib is often painful, but most do not cause other problems. Most rib fractures heal on their own in 1-3 months. Follow these instructions at home:  Avoid activities that cause pain to the injured area. Protect your injured area.  Slowly increase activity as told by your doctor.  Take medicine as told by your doctor.  Put ice on the injured area for the first 1-2 days after you have been treated or as told by your doctor. ? Put ice in a plastic bag. ? Place a towel between your skin and the bag. ? Leave the ice on for 15-20 minutes at a time, every 2 hours while you are awake.  Do deep breathing as told by your doctor. You may be told to: ? Take deep breaths many times a day. ? Cough many times a day while hugging a pillow. ? Use a device (incentive spirometer) to perform deep breathing many times a day.  Drink enough fluids to keep your pee (urine) clear or pale yellow.  Do not wear a rib belt or binder. These do not allow you to breathe deeply. Get help right away if:  You have a fever.  You have trouble breathing.  You cannot stop coughing.  You cough up thick or bloody spit (mucus).  You feel sick to your stomach (nauseous), throw up (vomit), or have belly (abdominal) pain.  Your pain gets worse and medicine does not help. This information is not intended to replace advice given to you by your health care provider. Make sure you discuss any questions you have with your health care provider. Document Released: 07/31/2008 Document Revised: 03/29/2016 Document Reviewed: 12/24/2012 Elsevier Interactive Patient Education  2018 Elsevier Inc.     Agustina Caroli, MD [facility]

## 2018-05-28 NOTE — Assessment & Plan Note (Signed)
Clinically stable.  Tolerating pain well.  No red flag signs or symptoms.  No complications.  No pneumothorax.

## 2018-05-28 NOTE — Patient Instructions (Addendum)
     IF you received an x-ray today, you will receive an invoice from [facility] Radiology. Please contact [facility] Radiology at [phone] with questions or concerns regarding your invoice.   IF you received labwork today, you will receive an invoice from [place]. Please contact [facility] at [phone] with questions or concerns regarding your invoice.   Our billing staff will not be able to assist you with questions regarding bills from these companies.  You will be contacted with the lab results as soon as they are available. The fastest way to get your results is to activate your My Chart account. Instructions are located on the last page of this paperwork. If you have not heard from [place] regarding the results in 2 weeks, please contact this office.     Rib Fracture A rib fracture is a break or crack in one of the bones of the ribs. The ribs are like a cage that goes around your upper chest. A broken or cracked rib is often painful, but most do not cause other problems. Most rib fractures heal on their own in 1-3 months. Follow these instructions at home:  Avoid activities that cause pain to the injured area. Protect your injured area.  Slowly increase activity as told by your doctor.  Take medicine as told by your doctor.  Put ice on the injured area for the first 1-2 days after you have been treated or as told by your doctor. ? Put ice in a plastic bag. ? Place a towel between your skin and the bag. ? Leave the ice on for 15-20 minutes at a time, every 2 hours while you are awake.  Do deep breathing as told by your doctor. You may be told to: ? Take deep breaths many times a day. ? Cough many times a day while hugging a pillow. ? Use a device (incentive spirometer) to perform deep breathing many times a day.  Drink enough fluids to keep your pee (urine) clear or pale yellow.  Do not wear a rib belt or binder. These do not allow you to breathe deeply. Get help right away  if:  You have a fever.  You have trouble breathing.  You cannot stop coughing.  You cough up thick or bloody spit (mucus).  You feel sick to your stomach (nauseous), throw up (vomit), or have belly (abdominal) pain.  Your pain gets worse and medicine does not help. This information is not intended to replace advice given to you by your health care provider. Make sure you discuss any questions you have with your health care provider. Document Released: 07/31/2008 Document Revised: 03/29/2016 Document Reviewed: 12/24/2012 Elsevier Interactive Patient Education  [employer].

## 2018-06-12 ENCOUNTER — Other Ambulatory Visit: Payer: Self-pay

## 2018-06-12 ENCOUNTER — Ambulatory Visit (INDEPENDENT_AMBULATORY_CARE_PROVIDER_SITE_OTHER): Payer: Medicare HMO

## 2018-06-12 ENCOUNTER — Ambulatory Visit (INDEPENDENT_AMBULATORY_CARE_PROVIDER_SITE_OTHER): Payer: Medicare HMO | Admitting: Emergency Medicine

## 2018-06-12 ENCOUNTER — Encounter: Payer: Self-pay | Admitting: Emergency Medicine

## 2018-06-12 VITALS — BP 138/64 | HR 88 | Temp 98.3°F | Resp 16 | Ht 72.0 in | Wt 168.4 lb

## 2018-06-12 DIAGNOSIS — S2241XD Multiple fractures of ribs, right side, subsequent encounter for fracture with routine healing: Secondary | ICD-10-CM

## 2018-06-12 DIAGNOSIS — S2231XD Fracture of one rib, right side, subsequent encounter for fracture with routine healing: Secondary | ICD-10-CM | POA: Diagnosis not present

## 2018-06-12 NOTE — Patient Instructions (Addendum)
     IF you received an x-ray today, you will receive an invoice from [facility]. Please contact [facility] Radiology at [phone] with questions or concerns regarding your invoice.   IF you received labwork today, you will receive an invoice from [place]. Please contact [facility] at [phone] with questions or concerns regarding your invoice.   Our billing staff will not be able to assist you with questions regarding bills from these companies.  You will be contacted with the lab results as soon as they are available. The fastest way to get your results is to activate your My Chart account. Instructions are located on the last page of this paperwork. If you have not heard from [place] regarding the results in 2 weeks, please contact this office.     Rib Fracture A rib fracture is a break or crack in one of the bones of the ribs. The ribs are like a cage that goes around your upper chest. A broken or cracked rib is often painful, but most do not cause other problems. Most rib fractures heal on their own in 1-3 months. Follow these instructions at home:  Avoid activities that cause pain to the injured area. Protect your injured area.  Slowly increase activity as told by your doctor.  Take medicine as told by your doctor.  Put ice on the injured area for the first 1-2 days after you have been treated or as told by your doctor. ? Put ice in a plastic bag. ? Place a towel between your skin and the bag. ? Leave the ice on for 15-20 minutes at a time, every 2 hours while you are awake.  Do deep breathing as told by your doctor. You may be told to: ? Take deep breaths many times a day. ? Cough many times a day while hugging a pillow. ? Use a device (incentive spirometer) to perform deep breathing many times a day.  Drink enough fluids to keep your pee (urine) clear or pale yellow.  Do not wear a rib belt or binder. These do not allow you to breathe deeply. Get help right away  if:  You have a fever.  You have trouble breathing.  You cannot stop coughing.  You cough up thick or bloody spit (mucus).  You feel sick to your stomach (nauseous), throw up (vomit), or have belly (abdominal) pain.  Your pain gets worse and medicine does not help. This information is not intended to replace advice given to you by your health care provider. Make sure you discuss any questions you have with your health care provider. Document Released: 07/31/2008 Document Revised: 03/29/2016 Document Reviewed: 12/24/2012 Elsevier Interactive Patient Education  [employer].

## 2018-06-12 NOTE — Progress Notes (Signed)
Jerome Price 67 y.o.   Chief Complaint  Patient presents with  . Rib Fracture    right side - follow up    HISTORY OF PRESENT ILLNESS: This is a 67 y.o. male seen by me on 05/28/2018 with a fracture rib on the right side.  Feeling a lot better.  Has no complaints or medical concerns as of today.  HPI   Prior to Admission medications   Not on File    No Known Allergies  Patient Active Problem List   Diagnosis Date Noted  . Contusion of rib on right side 05/28/2018  . Abrasion of right arm 05/28/2018  . Multiple closed fractures of ribs of right side 05/28/2018  . Need for prophylactic vaccination against Streptococcus pneumoniae (pneumococcus) 05/28/2018  . Coronary artery calcification seen on CAT scan 11/30/2015  . History of tobacco abuse 11/30/2015  . Malignant neoplasm of upper lobe of left lung ([place]) 12/13/2014  . Lung cancer ([place]) 12/10/2012    Past Medical History:  Diagnosis Date  . Cancer ([place]) lung ca    dx'd 2008    Past Surgical History:  Procedure Laterality Date  . EYE MUSCLE SURGERY Right   . LUNG REMOVAL, PARTIAL      Social History   Socioeconomic History  . Marital status: Married    Spouse name: Not on file  . Number of children: Not on file  . Years of education: Not on file  . Highest education level: Not on file  Occupational History  . Not on file  Social Needs  . Financial resource strain: Not on file  . Food insecurity:    Worry: Not on file    Inability: Not on file  . Transportation needs:    Medical: Not on file    Non-medical: Not on file  Tobacco Use  . Smoking status: Former Smoker    Last attempt to quit: 11/05/1998    Years since quitting: 19.6  . Smokeless tobacco: Never Used  Substance and Sexual Activity  . Alcohol use: Yes    Alcohol/week: 0.0 standard drinks    Comment: occasional beer  . Drug use: No  . Sexual activity: Not on file  Lifestyle  . Physical activity:    Days per week: Not on file   Minutes per session: Not on file  . Stress: Not on file  Relationships  . Social connections:    Talks on phone: Not on file    Gets together: Not on file    Attends religious service: Not on file    Active member of club or organization: Not on file    Attends meetings of clubs or organizations: Not on file    Relationship status: Not on file  . Intimate partner violence:    Fear of current or ex partner: Not on file    Emotionally abused: Not on file    Physically abused: Not on file    Forced sexual activity: Not on file  Other Topics Concern  . Not on file  Social History Narrative  . Not on file    Family History  Problem Relation Age of Onset  . Lung cancer Father   . Heart attack Neg Hx   . Hypertension Neg Hx   . Stroke Neg Hx   . Colon cancer Neg Hx   . Esophageal cancer Neg Hx   . Pancreatic cancer Neg Hx   . Rectal cancer Neg Hx   . Stomach cancer Neg  Hx      Review of Systems  Constitutional: Negative.  Negative for chills and fever.  HENT: Negative.  Negative for congestion, nosebleeds and sore throat.   Eyes: Negative.  Negative for blurred vision and double vision.  Respiratory: Negative.  Negative for cough, hemoptysis, shortness of breath and wheezing.   Cardiovascular: Negative.  Negative for chest pain and palpitations.  Gastrointestinal: Negative.  Negative for abdominal pain, diarrhea, nausea and vomiting.  Skin: Negative.  Negative for rash.  Neurological: Negative.  Negative for dizziness and headaches.  Endo/Heme/Allergies: Negative.   All other systems reviewed and are negative.   Vitals:   06/12/18 0814  BP: (!) 142/73  Pulse: 88  Resp: 16  Temp: 98.3 F (36.8 C)  SpO2: 96%    Physical Exam  Constitutional: He is oriented to person, place, and time. He appears well-developed and well-nourished.  HENT:  Head: Normocephalic and atraumatic.  Eyes: Pupils are equal, round, and reactive to light. EOM are normal.  Neck: Normal range  of motion. Neck supple.  Cardiovascular: Normal rate and regular rhythm.  Pulmonary/Chest: Effort normal and breath sounds normal. No respiratory distress. He exhibits no tenderness.  Abdominal: Soft. He exhibits no distension. There is no tenderness.  Musculoskeletal: Normal range of motion. He exhibits no edema or tenderness.  Neurological: He is alert and oriented to person, place, and time. No sensory deficit. He exhibits normal muscle tone.  Skin: Skin is warm and dry. Capillary refill takes less than 2 seconds.  Psychiatric: He has a normal mood and affect. His behavior is normal.  Vitals reviewed.   Dg Ribs Unilateral W/chest Right  Result Date: 06/12/2018 CLINICAL DATA:  Follow-up right rib fractures EXAM: RIGHT RIBS AND CHEST - 3+ VIEW COMPARISON:  05/28/2018 FINDINGS: Cardiac shadow is stable. Stable scarring in the left base is seen. No pneumothorax is seen. The previously noted fractures of the right fourth and fifth ribs are again identified with mild callus formation. No new focal abnormality seen IMPRESSION: Healing right rib fractures without complicating factors. Electronically Signed   By: Inez Catalina M.D.   On: 06/12/2018 09:09   A total of 25 minutes was spent in the room with the patient, greater than 50% of which was in counseling/coordination of care regarding treatment, medications, prognosis, and need for follow-up if no better or worse.  X-rays reviewed with patient in the room.  Radiologist interpretation reviewed and agreed with..  ASSESSMENT & PLAN:  Jerome Price was seen today for rib fracture.  Diagnoses and all orders for this visit:  Closed fracture of multiple ribs of right side with routine healing, subsequent encounter Comments: Much improved Orders: -     DG Ribs Unilateral W/Chest Right; Future    Patient Instructions       IF you received an x-ray today, you will receive an invoice from [facility] Radiology. Please contact [facility] Radiology at  [phone] with questions or concerns regarding your invoice.   IF you received labwork today, you will receive an invoice from [place]. Please contact [facility] at [phone] with questions or concerns regarding your invoice.   Our billing staff will not be able to assist you with questions regarding bills from these companies.  You will be contacted with the lab results as soon as they are available. The fastest way to get your results is to activate your My Chart account. Instructions are located on the last page of this paperwork. If you have not heard from [place] regarding the  results in 2 weeks, please contact this office.     Rib Fracture A rib fracture is a break or crack in one of the bones of the ribs. The ribs are like a cage that goes around your upper chest. A broken or cracked rib is often painful, but most do not cause other problems. Most rib fractures heal on their own in 1-3 months. Follow these instructions at home:  Avoid activities that cause pain to the injured area. Protect your injured area.  Slowly increase activity as told by your doctor.  Take medicine as told by your doctor.  Put ice on the injured area for the first 1-2 days after you have been treated or as told by your doctor. ? Put ice in a plastic bag. ? Place a towel between your skin and the bag. ? Leave the ice on for 15-20 minutes at a time, every 2 hours while you are awake.  Do deep breathing as told by your doctor. You may be told to: ? Take deep breaths many times a day. ? Cough many times a day while hugging a pillow. ? Use a device (incentive spirometer) to perform deep breathing many times a day.  Drink enough fluids to keep your pee (urine) clear or pale yellow.  Do not wear a rib belt or binder. These do not allow you to breathe deeply. Get help right away if:  You have a fever.  You have trouble breathing.  You cannot stop coughing.  You cough up thick or bloody spit  (mucus).  You feel sick to your stomach (nauseous), throw up (vomit), or have belly (abdominal) pain.  Your pain gets worse and medicine does not help. This information is not intended to replace advice given to you by your health care provider. Make sure you discuss any questions you have with your health care provider. Document Released: 07/31/2008 Document Revised: 03/29/2016 Document Reviewed: 12/24/2012 Elsevier Interactive Patient Education  2018 Elsevier Inc.     Jerome Caroli, MD [facility]

## 2018-11-14 ENCOUNTER — Ambulatory Visit: Payer: Self-pay

## 2018-11-14 NOTE — Telephone Encounter (Signed)
Incoming call from Patient who complains of rectal bleeding.  . Patient reports bright red blood on the tissue.  Blood wasn't mixed in with stool.  Patient could not state how much blood was in stool.  Toilet water was not red.  Patient states " it was not a lot".   State it was a formed stool.  Denies constipation, and blood thinners.  Denies any other sx.   Provided appointment for 11/17/18 Monday.  @ 08:25.am. With   Dr. Franky Macho  Patient voiced understanding.    Provided care advice Patient voiced understanding.                                                                                                                                                                                                                                                                                                                              Reason for Disposition . MILD rectal bleeding (more than just a few drops or streaks)  Answer Assessment - Initial Assessment Questions 1. APPEARANCE of BLOOD: "What color is it?" "Is it passed separately, on the surface of the stool, or mixed in with the stool?"      Tissue  Bright red 2. AMOUNT: "How much blood was passed?"      Not a real lot 3. FREQUENCY: "How many times has blood been passed with the stools?"      Less than a month lest than a month , not everyday 4. ONSET: "When was the blood first seen in the stools?" (Days or weeks)       About 3 weeks ago 5. DIARRHEA: "Is there also some diarrhea?" If so, ask: "How many diarrhea stools were passed in past 24 hours?"      Formed stool  6. CONSTIPATION: "Do you have constipation?" If so, "How bad is it?"     denies 7. RECURRENT SYMPTOMS: "Have you had blood in your stools before?" If so, ask: "When was  the last time?" and "What happened that time?"      yes 8. BLOOD THINNERS: "Do you take any blood thinners?" (e.g., Coumadin/warfarin, Pradaxa/dabigatran, aspirin)     denies 9. OTHER SYMPTOMS: "Do you have any  other symptoms?"  (e.g., abdominal pain, vomiting, dizziness, fever)     denies 10. PREGNANCY: "Is there any chance you are pregnant?" "When was your last menstrual period?"      denies  Protocols used: RECTAL BLEEDING-A-AH

## 2018-11-17 ENCOUNTER — Encounter: Payer: Self-pay | Admitting: Emergency Medicine

## 2018-11-17 ENCOUNTER — Ambulatory Visit (INDEPENDENT_AMBULATORY_CARE_PROVIDER_SITE_OTHER): Payer: Medicare HMO | Admitting: Emergency Medicine

## 2018-11-17 ENCOUNTER — Other Ambulatory Visit: Payer: Self-pay

## 2018-11-17 VITALS — BP 130/70 | HR 92 | Temp 98.6°F | Resp 16 | Ht 72.0 in | Wt 168.2 lb

## 2018-11-17 DIAGNOSIS — K625 Hemorrhage of anus and rectum: Secondary | ICD-10-CM | POA: Insufficient documentation

## 2018-11-17 DIAGNOSIS — Z8719 Personal history of other diseases of the digestive system: Secondary | ICD-10-CM | POA: Diagnosis not present

## 2018-11-17 LAB — POCT CBC
Granulocyte percent: 68.6 %G (ref 37–80)
HCT, POC: 41.7 % — AB (ref 29–41)
HEMOGLOBIN: 14.1 g/dL (ref 11–14.6)
Lymph, poc: 1.6 (ref 0.6–3.4)
MCH: 29.7 pg (ref 27–31.2)
MCHC: 33.8 g/dL (ref 31.8–35.4)
MCV: 88 fL (ref 76–111)
MID (cbc): 0.5 (ref 0–0.9)
MPV: 6.9 fL (ref 0–99.8)
PLATELET COUNT, POC: 248 10*3/uL (ref 142–424)
POC Granulocyte: 4.6 (ref 2–6.9)
POC LYMPH PERCENT: 24.1 %L (ref 10–50)
POC MID %: 7.3 %M (ref 0–12)
RBC: 4.74 M/uL (ref 4.69–6.13)
RDW, POC: 12.9 %
WBC: 6.7 10*3/uL (ref 4.6–10.2)

## 2018-11-17 LAB — IFOBT (OCCULT BLOOD): IFOBT: POSITIVE

## 2018-11-17 NOTE — Patient Instructions (Addendum)
     If you have lab work done today you will be contacted with your lab results within the next 2 weeks.  If you have not heard from [place] then please contact us. The fastest way to get your results is to register for My Chart.   IF you received an x-ray today, you will receive an invoice from [facility]. Please contact [facility] at [phone] with questions or concerns regarding your invoice.   IF you received labwork today, you will receive an invoice from [place]. Please contact [facility] at [phone] with questions or concerns regarding your invoice.   Our billing staff will not be able to assist you with questions regarding bills from these companies.  You will be contacted with the lab results as soon as they are available. The fastest way to get your results is to activate your My Chart account. Instructions are located on the last page of this paperwork. If you have not heard from [place] regarding the results in 2 weeks, please contact this office.     Rectal Bleeding  Rectal bleeding is when blood comes out of the opening of the butt (anus). People with this kind of bleeding may notice bright red blood in their underwear or in the toilet after they poop (have a bowel movement). They may also have dark red or black poop (stool). Rectal bleeding is often a sign that something is wrong. It needs to be checked by a doctor. Follow these instructions at home: Watch for any changes in your condition. Take these actions to help with bleeding and discomfort:  Eat a diet that is high in fiber. This will keep your poop soft so it is easier for you to poop without pushing too hard. Ask your doctor to tell you what foods and drinks are high in fiber.  Drink enough fluid to keep your pee (urine) clear or pale yellow. This also helps keep your poop soft.  Try taking a warm bath. This may help with pain.  Keep all follow-up visits as told by your doctor. This is  important. Get help right away if:  You have new bleeding.  You have more bleeding than before.  You have black or dark red poop.  You throw up (vomit) blood or something that looks like coffee grounds.  You have pain or tenderness in your belly (abdomen).  You have a fever.  You feel weak.  You feel sick to your stomach (nauseous).  You pass out (faint).  You have very bad pain in your butt.  You cannot poop. This information is not intended to replace advice given to you by your health care provider. Make sure you discuss any questions you have with your health care provider. Document Released: 07/04/2011 Document Revised: 03/29/2016 Document Reviewed: 12/18/2015 Elsevier Interactive Patient Education  [employer].

## 2018-11-17 NOTE — Progress Notes (Signed)
Jerome Price 68 y.o.   Chief Complaint  Patient presents with  . Rectal Bleeding    x 1 week - per patient when he wipe it is blood on tissue    HISTORY OF PRESENT ILLNESS: This is a 68 y.o. male complaining of intermittent episodes of rectal bleeding for the past week.  He says he notices blood on the tissue but not mixed in with his stools.  States he is not "leaking blood".  Eating and drinking well.  Denies nausea or vomiting.  Denies rectal or abdominal pain.  Had colonoscopy done 1 year ago and told to return in 5 years due to polyps.  Denies syncope or lightheadedness.  Denies any other significant symptomatology.  HPI   Prior to Admission medications   Not on File    No Known Allergies  Patient Active Problem List   Diagnosis Date Noted  . Contusion of rib on right side 05/28/2018  . Abrasion of right arm 05/28/2018  . Multiple closed fractures of ribs of right side 05/28/2018  . Need for prophylactic vaccination against Streptococcus pneumoniae (pneumococcus) 05/28/2018  . Coronary artery calcification seen on CAT scan 11/30/2015  . History of tobacco abuse 11/30/2015  . 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 Lung cancer ([place]) 12/10/2012    Past Medical History:  Diagnosis Date  . Cancer ([place]) lung ca    dx'd 2008    Past Surgical History:  Procedure Laterality Date  . EYE MUSCLE SURGERY Right   . LUNG REMOVAL, PARTIAL      Social History   Socioeconomic History  . Marital status: Married    Spouse name: Not on file  . Number of children: Not on file  . Years of education: Not on file  . Highest education level: Not on file  Occupational History  . Not on file  Social Needs  . Financial resource strain: Not on file  . Food insecurity:    Worry: Not on file    Inability: Not on file  . Transportation needs:    Medical: Not on file    Non-medical: Not on file  Tobacco Use  . Smoking status: Former Smoker    Last  attempt to quit: 11/05/1998    Years since quitting: 20.0  . Smokeless tobacco: Never Used  Substance and Sexual Activity  . Alcohol use: Yes    Alcohol/week: 0.0 standard drinks    Comment: occasional beer  . Drug use: No  . Sexual activity: Not on file  Lifestyle  . Physical activity:    Days per week: Not on file    Minutes per session: Not on file  . Stress: Not on file  Relationships  . Social connections:    Talks on phone: Not on file    Gets together: Not on file    Attends religious service: Not on file    Active member of club or organization: Not on file    Attends meetings of clubs or organizations: Not on file    Relationship status: Not on file  . Intimate partner violence:    Fear of current or ex partner: Not on file    Emotionally abused: Not on file    Physically abused: Not on file    Forced sexual activity: Not on file  Other Topics Concern  . Not on file  Social History Narrative  . Not on file    Family History  Problem Relation  Age of Onset  . Lung cancer Father   . Heart attack Neg Hx   . Hypertension Neg Hx   . Stroke Neg Hx   . Colon cancer Neg Hx   . Esophageal cancer Neg Hx   . Pancreatic cancer Neg Hx   . Rectal cancer Neg Hx   . Stomach cancer Neg Hx      Review of Systems  Constitutional: Negative.  Negative for chills and fever.  HENT: Negative.  Negative for congestion, nosebleeds and sore throat.   Eyes: Negative.   Respiratory: Negative.  Negative for cough and shortness of breath.   Cardiovascular: Negative.  Negative for chest pain and palpitations.  Gastrointestinal: Positive for blood in stool. Negative for abdominal pain, diarrhea, melena, nausea and vomiting.  Genitourinary: Negative.  Negative for dysuria.  Musculoskeletal: Negative.  Negative for back pain, myalgias and neck pain.  Skin: Negative.  Negative for rash.  Neurological: Negative.  Negative for dizziness, loss of consciousness and headaches.    Endo/Heme/Allergies: Negative.     Vitals:   11/17/18 0829  BP: (!) 155/65  Pulse: 92  Resp: 16  Temp: 98.6 F (37 C)  SpO2: 97%    Physical Exam Vitals signs reviewed.  Constitutional:      Appearance: Normal appearance.  HENT:     Head: Normocephalic and atraumatic.     Nose: Nose normal.     Mouth/Throat:     Mouth: Mucous membranes are moist.     Pharynx: Oropharynx is clear.  Eyes:     Extraocular Movements: Extraocular movements intact.     Conjunctiva/sclera: Conjunctivae normal.     Pupils: Pupils are equal, round, and reactive to light.  Neck:     Musculoskeletal: Normal range of motion and neck supple.  Cardiovascular:     Rate and Rhythm: Normal rate and regular rhythm.     Heart sounds: Normal heart sounds.  Pulmonary:     Effort: Pulmonary effort is normal.     Breath sounds: Normal breath sounds.  Abdominal:     General: Bowel sounds are normal. There is no distension.     Palpations: Abdomen is soft.     Tenderness: There is no abdominal tenderness.  Genitourinary:    Prostate: Enlarged. Not tender and no nodules present.     Rectum: Guaiac result positive. External hemorrhoid present. No mass, tenderness, anal fissure or internal hemorrhoid. Normal anal tone.  Musculoskeletal: Normal range of motion.  Skin:    General: Skin is warm and dry.     Capillary Refill: Capillary refill takes less than 2 seconds.  Neurological:     General: No focal deficit present.     Mental Status: He is alert and oriented to person, place, and time.  Psychiatric:        Mood and Affect: Mood normal.        Behavior: Behavior normal.    Orthostatic VS for the past 24 hrs:  BP- Lying Pulse- Lying BP- Sitting Pulse- Sitting BP- Standing at 0 minutes Pulse- Standing at 0 minutes  11/17/18 0852 135/68 87 (!) 132/91 91 112/69 106   Colonoscopy report done on 10/10/2017: Impression:               - One 5 mm polyp in the transverse colon, removed                             with a cold  snare. Resected and retrieved.                           - Moderate diverticulosis in the sigmoid colon and                            in the descending colon. There was evidence of an                            impacted diverticulum. There was no evidence of                            diverticular bleeding.                           - Friability with contact bleeding in the cecum.  Lab Results  Component Value Date   WBC 5.1 09/12/2016   HGB 14.1 09/12/2016   HCT 38.6 (A) 09/12/2016   MCV 85.9 09/12/2016   PLT 297 07/25/2016   Results for orders placed or performed in visit on 11/17/18 (from the past 24 hour(s))  IFOBT POC (occult bld, rslt in office)     Status: None   Collection Time: 11/17/18  9:04 AM  Result Value Ref Range   IFOBT Positive   POCT CBC     Status: Abnormal   Collection Time: 11/17/18  9:13 AM  Result Value Ref Range   WBC 6.7 4.6 - 10.2 K/uL   Lymph, poc 1.6 0.6 - 3.4   POC LYMPH PERCENT 24.1 10 - 50 %L   MID (cbc) 0.5 0 - 0.9   POC MID % 7.3 0 - 12 %M   POC Granulocyte 4.6 2 - 6.9   Granulocyte percent 68.6 37 - 80 %G   RBC 4.74 4.69 - 6.13 M/uL   Hemoglobin 14.1 11 - 14.6 g/dL   HCT, POC 41.7 (A) 29 - 41 %   MCV 88.0 76 - 111 fL   MCH, POC 29.7 27 - 31.2 pg   MCHC 33.8 31.8 - 35.4 g/dL   RDW, POC 12.9 %   Platelet Count, POC 248 142 - 424 K/uL   MPV 6.9 0 - 99.8 fL   A total of 40 minutes was spent in the room with the patient, greater than 50% of which was in counseling/coordination of care regarding differential diagnosis, treatment, medications, blood results, review of colonoscopy report, and need for follow-up with GI doctor this week.  ASSESSMENT & PLAN: Rectal bleeding Clinically stable.  No red flag signs or symptoms.  Normal CBC with normal hemoglobin.  Benign physical exam.  Needs evaluation by GI doctor for possible sigmoidoscopy/anoscopy.  Jerome Price was seen today for rectal bleeding.  Diagnoses and all orders for this  visit:  Rectal bleeding -     Orthostatic vital signs -     POCT CBC -     Comprehensive metabolic panel -     IFOBT POC (occult bld, rslt in office); Future -     Ambulatory referral to Gastroenterology -     IFOBT POC (occult bld, rslt in office)  History of diverticulosis    Patient Instructions       If you have lab work done today you will be contacted with your lab results within the next  2 weeks.  If you have not heard from [place] then please contact us. The fastest way to get your results is to register for My Chart.   IF you received an x-ray today, you will receive an invoice from [facility]. Please contact [facility] at [phone] with questions or concerns regarding your invoice.   IF you received labwork today, you will receive an invoice from [place]. Please contact [facility] at [phone] with questions or concerns regarding your invoice.   Our billing staff will not be able to assist you with questions regarding bills from these companies.  You will be contacted with the lab results as soon as they are available. The fastest way to get your results is to activate your My Chart account. Instructions are located on the last page of this paperwork. If you have not heard from [place] regarding the results in 2 weeks, please contact this office.     Rectal Bleeding  Rectal bleeding is when blood comes out of the opening of the butt (anus). People with this kind of bleeding may notice bright red blood in their underwear or in the toilet after they poop (have a bowel movement). They may also have dark red or black poop (stool). Rectal bleeding is often a sign that something is wrong. It needs to be checked by a doctor. Follow these instructions at home: Watch for any changes in your condition. Take these actions to help with bleeding and discomfort:  Eat a diet that is high in fiber. This will keep your poop soft so it is easier for you to poop without  pushing too hard. Ask your doctor to tell you what foods and drinks are high in fiber.  Drink enough fluid to keep your pee (urine) clear or pale yellow. This also helps keep your poop soft.  Try taking a warm bath. This may help with pain.  Keep all follow-up visits as told by your doctor. This is important. Get help right away if:  You have new bleeding.  You have more bleeding than before.  You have black or dark red poop.  You throw up (vomit) blood or something that looks like coffee grounds.  You have pain or tenderness in your belly (abdomen).  You have a fever.  You feel weak.  You feel sick to your stomach (nauseous).  You pass out (faint).  You have very bad pain in your butt.  You cannot poop. This information is not intended to replace advice given to you by your health care provider. Make sure you discuss any questions you have with your health care provider. Document Released: 07/04/2011 Document Revised: 03/29/2016 Document Reviewed: 12/18/2015 Elsevier Interactive Patient Education  2019 [employer].      Agustina Caroli, MD [facility]

## 2018-11-17 NOTE — Assessment & Plan Note (Signed)
Clinically stable.  No red flag signs or symptoms.  Normal CBC with normal hemoglobin.  Benign physical exam.  Needs evaluation by GI doctor for possible sigmoidoscopy/anoscopy.

## 2018-11-18 LAB — COMPREHENSIVE METABOLIC PANEL
ALT: 21 IU/L (ref 0–44)
AST: 37 IU/L (ref 0–40)
Albumin/Globulin Ratio: 1.4 (ref 1.2–2.2)
Albumin: 3.9 g/dL (ref 3.6–4.8)
Alkaline Phosphatase: 68 IU/L (ref 39–117)
BUN/Creatinine Ratio: 13 (ref 10–24)
BUN: 15 mg/dL (ref 8–27)
Bilirubin Total: 0.4 mg/dL (ref 0.0–1.2)
CALCIUM: 8.8 mg/dL (ref 8.6–10.2)
CO2: 23 mmol/L (ref 20–29)
CREATININE: 1.14 mg/dL (ref 0.76–1.27)
Chloride: 102 mmol/L (ref 96–106)
GFR calc Af Amer: 77 mL/min/{1.73_m2} (ref >59)
GFR, EST NON AFRICAN AMERICAN: 67 mL/min/{1.73_m2} (ref >59)
GLOBULIN, TOTAL: 2.7 g/dL (ref 1.5–4.5)
Glucose: 138 mg/dL — ABNORMAL HIGH (ref 65–99)
Potassium: 4.6 mmol/L (ref 3.5–5.2)
SODIUM: 138 mmol/L (ref 134–144)
Total Protein: 6.6 g/dL (ref 6.0–8.5)

## 2018-11-20 ENCOUNTER — Encounter: Payer: Self-pay | Admitting: Physician Assistant

## 2018-11-20 ENCOUNTER — Ambulatory Visit: Payer: Medicare HMO | Admitting: Physician Assistant

## 2018-11-20 VITALS — BP 130/60 | HR 78 | Ht 73.0 in | Wt 170.0 lb

## 2018-11-20 DIAGNOSIS — Z86010 Personal history of colonic polyps: Secondary | ICD-10-CM

## 2018-11-20 DIAGNOSIS — K625 Hemorrhage of anus and rectum: Secondary | ICD-10-CM

## 2018-11-20 DIAGNOSIS — K648 Other hemorrhoids: Secondary | ICD-10-CM

## 2018-11-20 MED ORDER — HYDROCORTISONE 2.5 % RE CREA: TOPICAL_CREAM | RECTAL | 1 refills | Status: DC

## 2018-11-20 NOTE — Progress Notes (Signed)
If continues to have persistent symptoms, can consider hemorrhoidal band ligation.   Reviewed and agree with documentation and assessment and plan. Damaris Hippo , MD

## 2018-11-20 NOTE — Patient Instructions (Signed)
We sent a prescription to [place]. 1. Anusol HC Cream 2.5 %.  Purchase over the counter Preperation H suppositories. You can apply a pea size amount of cream on the suppository and insert in the rectum twice daily. Repeat for 1 week.   Call us if any more bleeding af using the suppositories. Call us at [phone].  Normal BMI (Body Mass Index- based on height and weight) is between 23 and 30. Your BMI today is Body mass index is 22.43 kg/m. Marland Kitchen Please consider follow up  regarding your BMI with your Primary Care Provider.

## 2018-11-20 NOTE — Progress Notes (Signed)
Chief Complaint: Rectal bleeding  HPI:    Jerome Price is a 68 year old male with a past medical history as listed below, known to Dr. Silverio Decamp, who presents clinic today with rectal bleeding.    10/10/2017 colonoscopy with one 5 mm polyp in the transverse colon, moderate diverticulosis in the sigmoid and descending colon as well as evidence of an impacted diverticulum with no evidence of diverticular bleeding and friability with contact bleeding in the cecum.  Pathology shows tubular adenoma.  Repeat recommended in 5 years.    11/17/2018 seen by PCP for rectal bleeding.  Patient had rectal exam with guaiac positive results and external hemorrhoid.  CBC was normal at that time.    Today, patient explains that for the past 2 weeks he has been seeing some bright red blood on the toilet paper when wiping after bowel movement.  Tells me this was consistently every day for a week and now he can have a couple of days without seeing any blood, in fact this morning he did not see any.  Denies any rectal irritation or pain.  Tells me he maintains pretty regular bowel movements that are typically soft solid.    Medical history positive for lung cancer back in 2008 this does make the patient somewhat apprehensive.    Denies fever, chills, weight loss, change in bowel habits, abdominal pain or rectal pain.  Past Medical History:  Diagnosis Date  . Cancer ([place]) lung ca    dx'd 2008    Past Surgical History:  Procedure Laterality Date  . EYE MUSCLE SURGERY Right   . LUNG REMOVAL, PARTIAL      No current outpatient medications on file.   Current Facility-Administered Medications  Medication Dose Route Frequency Provider Last Rate Last Dose  . 0.9 %  sodium chloride infusion  500 mL Intravenous Continuous Mauri Pole, MD        Allergies as of 11/20/2018  . (No Known Allergies)    Family History  Problem Relation Age of Onset  . Lung cancer Father   . Heart attack Neg Hx   . Hypertension  Neg Hx   . Stroke Neg Hx   . Colon cancer Neg Hx   . Esophageal cancer Neg Hx   . Pancreatic cancer Neg Hx   . Rectal cancer Neg Hx   . Stomach cancer Neg Hx     Social History   Socioeconomic History  . Marital status: Married    Spouse name: Not on file  . Number of children: Not on file  . Years of education: Not on file  . Highest education level: Not on file  Occupational History  . Not on file  Social Needs  . Financial resource strain: Not on file  . Food insecurity:    Worry: Not on file    Inability: Not on file  . Transportation needs:    Medical: Not on file    Non-medical: Not on file  Tobacco Use  . Smoking status: Former Smoker    Last attempt to quit: 11/05/1998    Years since quitting: 20.0  . Smokeless tobacco: Never Used  Substance and Sexual Activity  . Alcohol use: Yes    Alcohol/week: 0.0 standard drinks    Comment: occasional beer  . Drug use: No  . Sexual activity: Not on file  Lifestyle  . Physical activity:    Days per week: Not on file    Minutes per session: Not on file  .  Stress: Not on file  Relationships  . Social connections:    Talks on phone: Not on file    Gets together: Not on file    Attends religious service: Not on file    Active member of club or organization: Not on file    Attends meetings of clubs or organizations: Not on file    Relationship status: Not on file  . Intimate partner violence:    Fear of current or ex partner: Not on file    Emotionally abused: Not on file    Physically abused: Not on file    Forced sexual activity: Not on file  Other Topics Concern  . Not on file  Social History Narrative  . Not on file    Review of Systems:    Constitutional: No weight loss, fever or chills Cardiovascular: No chest pain Respiratory: No SOB Gastrointestinal: See HPI and otherwise negative   Physical Exam:  Vital signs: BP 130/60   Pulse 78   Ht 6\' 1"  (1.854 m)   Wt 170 lb (77.1 kg)   BMI 22.43 kg/m     Constitutional:   Pleasant Caucasian male appears to be in NAD, Well developed, Well nourished, alert and cooperative Respiratory: Respirations even and unlabored. Lungs clear to auscultation bilaterally.   No wheezes, crackles, or rhonchi.  Cardiovascular: Normal S1, S2. No MRG. Regular rate and rhythm. No peripheral edema, cyanosis or pallor.  Gastrointestinal:  Soft, nondistended, nontender. No rebound or guarding. Normal bowel sounds. No appreciable masses or hepatomegaly. Rectal: External: external hemorrhoid, no stigmata of bleeding, some brown stool discharge; Internal: no mass or fissure, no ttp, tight sphincter tone; Anoscopy: Inflamed grade I internal hemorrhoids Psychiatric: Demonstrates good judgement and reason without abnormal affect or behaviors.  MOST RECENT LABS AND IMAGING: CBC    Component Value Date/Time   WBC 6.7 11/17/2018 0913   WBC 19.4 (H) 07/25/2016 1241   RBC 4.74 11/17/2018 0913   RBC 4.91 07/25/2016 1241   HGB 14.1 11/17/2018 0913   HGB 15.1 07/25/2016 1241   HGB 13.8 06/13/2015 0807   HCT 41.7 (A) 11/17/2018 0913   HCT 43.6 07/25/2016 1241   HCT 40.6 06/13/2015 0807   PLT 297 07/25/2016 1241   PLT 197 06/13/2015 0807   MCV 88.0 11/17/2018 0913   MCV 90.6 06/13/2015 0807   MCH 29.7 11/17/2018 0913   MCH 30.8 07/25/2016 1241   MCHC 33.8 11/17/2018 0913   MCHC 34.6 07/25/2016 1241   RDW 12.9 07/25/2016 1241   RDW 12.8 06/13/2015 0807   LYMPHSABS 1.7 06/13/2015 0807   MONOABS 0.6 06/13/2015 0807   EOSABS 0.1 06/13/2015 0807   BASOSABS 0.0 06/13/2015 0807    CMP     Component Value Date/Time   NA 138 11/17/2018 1021   NA 140 06/13/2015 0807   K 4.6 11/17/2018 1021   K 4.7 06/13/2015 0807   CL 102 11/17/2018 1021   CL 103 12/05/2012 0809   CO2 23 11/17/2018 1021   CO2 25 06/13/2015 0807   GLUCOSE 138 (H) 11/17/2018 1021   GLUCOSE 130 (H) 07/25/2016 1241   GLUCOSE 87 06/13/2015 0807   GLUCOSE 98 12/05/2012 0809   BUN 15 11/17/2018 1021    BUN 11.2 06/13/2015 0807   CREATININE 1.14 11/17/2018 1021   CREATININE 1.03 07/25/2016 1241   CREATININE 1.1 06/13/2015 0807   CALCIUM 8.8 11/17/2018 1021   CALCIUM 8.5 06/13/2015 0807   PROT 6.6 11/17/2018 1021   PROT 6.6 06/13/2015  1188   ALBUMIN 3.9 11/17/2018 1021   ALBUMIN 3.6 06/13/2015 0807   AST 37 11/17/2018 1021   AST 44 (H) 06/13/2015 0807   ALT 21 11/17/2018 1021   ALT 26 06/13/2015 0807   ALKPHOS 68 11/17/2018 1021   ALKPHOS 77 06/13/2015 0807   BILITOT 0.4 11/17/2018 1021   BILITOT 0.48 06/13/2015 0807   GFRNONAA 67 11/17/2018 1021   GFRNONAA >89 11/05/2015 1244   GFRAA 77 11/17/2018 1021   GFRAA >89 11/05/2015 1244    Assessment: 1.  Rectal bleeding: Small amount of bright red blood on the toilet paper after wiping for a bowel movement 2.  Internal hemorrhoids: Seen at time of exam today, likely source of bleeding above 3.  History of adenomatous polyps: Last colonoscopy in December 2018  Plan: 1.  Reassured the patient that he just had a colonoscopy within the past year, so likely internal hemorrhoids seen at time of exam today are the cause of his bleeding. 2.  Prescribed Anusol cream to be applied twice daily to Preparation H suppositories for a week.  Told the patient to repeat for a week if he does not see resolution of bleeding. 3.  Discussed with patient he needs to try to avoid straining or sitting on the toilet for long periods of time and try to maintain soft solid bowel movements. 4.  Patient to call our clinic if he sees any further bleeding or has concerns after 2 weeks above. 5.  Patient to follow in clinic with myself or Dr. Silverio Decamp as needed in the future or in 4 years for his next colonoscopy  Ellouise Newer, PA-C [facility] 11/20/2018, 8:53 AM  Cc: Horald Pollen, *

## 2019-02-17 DIAGNOSIS — H2511 Age-related nuclear cataract, right eye: Secondary | ICD-10-CM | POA: Diagnosis not present

## 2019-02-17 DIAGNOSIS — H53031 Strabismic amblyopia, right eye: Secondary | ICD-10-CM | POA: Diagnosis not present

## 2019-02-17 DIAGNOSIS — H538 Other visual disturbances: Secondary | ICD-10-CM | POA: Diagnosis not present

## 2019-07-14 ENCOUNTER — Telehealth (INDEPENDENT_AMBULATORY_CARE_PROVIDER_SITE_OTHER): Payer: Medicare HMO | Admitting: Emergency Medicine

## 2019-07-14 ENCOUNTER — Encounter: Payer: Self-pay | Admitting: Emergency Medicine

## 2019-07-14 ENCOUNTER — Other Ambulatory Visit: Payer: Self-pay

## 2019-07-14 VITALS — Ht 73.0 in | Wt 170.0 lb

## 2019-07-14 DIAGNOSIS — B349 Viral infection, unspecified: Secondary | ICD-10-CM | POA: Diagnosis not present

## 2019-07-14 DIAGNOSIS — Z20822 Contact with and (suspected) exposure to covid-19: Secondary | ICD-10-CM

## 2019-07-14 DIAGNOSIS — R6889 Other general symptoms and signs: Secondary | ICD-10-CM

## 2019-07-14 NOTE — Progress Notes (Signed)
Telemedicine Encounter- SOAP NOTE Established Patient  This telephone encounter was conducted with the patient's (or proxy's) verbal consent via audio telecommunications: yes Patient was instructed to have this encounter in a suitably private space; and to only have persons present to whom they give permission to participate. In addition, patient identity was confirmed by use of name plus two identifiers (DOB and address).  I discussed the limitations, risks, security and privacy concerns of performing an evaluation and management service by telephone and the availability of in person appointments. I also discussed with the patient that there may be a patient responsible charge related to this service. The patient expressed understanding and agreed to proceed.  I spent a total of 15 minutes talking with the patient or their proxy.  No chief complaint on file. Flu like symptoms  Subjective   Jerome Price is a 68 y.o. male established patient. Telephone visit today had severe flulike symptoms last week with fever chills, cough, generalized achiness that lasted 3 to 5 days.  Felt similar to pneumonia episode he had a couple of years ago however today he feels 100% better.  Was able to go out and play some golf today.  He does not think he had COVID 19 infection.  Does not want testing.  HPI   Patient Active Problem List   Diagnosis Date Noted  . History of diverticulosis 11/17/2018  . Coronary artery calcification seen on CAT scan 11/30/2015  . History of tobacco abuse 11/30/2015  . Malignant neoplasm of upper lobe of left lung ([place]) 12/13/2014  . Lung cancer ([place]) 12/10/2012    Past Medical History:  Diagnosis Date  . Cancer ([place]) lung ca    dx'd 2008    Current Outpatient Medications  Medication Sig Dispense Refill  . hydrocortisone (ANUSOL-HC) 2.5 % rectal cream Apply a pea size amount onto a rectal suppository and insert in the rectum. Do this twice daily. (Patient not  taking: Reported on 07/14/2019) 30 g 1   No current facility-administered medications for this visit.     No Known Allergies  Social History   Socioeconomic History  . Marital status: Married    Spouse name: Not on file  . Number of children: Not on file  . Years of education: Not on file  . Highest education level: Not on file  Occupational History  . Not on file  Social Needs  . Financial resource strain: Not on file  . Food insecurity    Worry: Not on file    Inability: Not on file  . Transportation needs    Medical: Not on file    Non-medical: Not on file  Tobacco Use  . Smoking status: Former Smoker    Quit date: 11/05/1998    Years since quitting: 20.7  . Smokeless tobacco: Never Used  Substance and Sexual Activity  . Alcohol use: Yes    Alcohol/week: 0.0 standard drinks    Comment: occasional beer  . Drug use: No  . Sexual activity: Not on file  Lifestyle  . Physical activity    Days per week: Not on file    Minutes per session: Not on file  . Stress: Not on file  Relationships  . Social Herbalist on phone: Not on file    Gets together: Not on file    Attends religious service: Not on file    Active member of club or organization: Not on file    Attends meetings  of clubs or organizations: Not on file    Relationship status: Not on file  . Intimate partner violence    Fear of current or ex partner: Not on file    Emotionally abused: Not on file    Physically abused: Not on file    Forced sexual activity: Not on file  Other Topics Concern  . Not on file  Social History Narrative  . Not on file    Review of Systems  Constitutional: Positive for chills, fever and malaise/fatigue.  HENT: Positive for congestion. Negative for sore throat.   Eyes: Negative.   Respiratory: Positive for cough. Negative for shortness of breath and wheezing.   Cardiovascular: Negative.  Negative for chest pain and palpitations.  Gastrointestinal: Negative.   Negative for abdominal pain, blood in stool, diarrhea, melena, nausea and vomiting.  Genitourinary: Negative.   Musculoskeletal: Positive for myalgias.  Skin: Negative.   Neurological: Positive for headaches. Negative for dizziness.  Endo/Heme/Allergies: Negative.   All other systems reviewed and are negative.   Objective  Alert and oriented x3 in no apparent respiratory distress Vitals as reported by the patient: Today's Vitals   07/14/19 1650  Weight: 170 lb (77.1 kg)  Height: 6\' 1"  (1.854 m)    There are no diagnoses linked to this encounter. Diagnoses and all orders for this visit:  Flu-like symptoms  Viral illness  Suspected Covid-19 Virus Infection    Clinically stable.  Asymptomatic.  No treatment necessary. I discussed the assessment and treatment plan with the patient. The patient was provided an opportunity to ask questions and all were answered. The patient agreed with the plan and demonstrated an understanding of the instructions.   The patient was advised to call back or seek an in-person evaluation if the symptoms worsen or if the condition fails to improve as anticipated.  I provided 15 minutes of non-face-to-face time during this encounter.  Horald Pollen, MD  Primary Care at [facility]

## 2019-07-14 NOTE — Progress Notes (Signed)
Called patient to triage for appointment. Patient states last week he had cough, not coughing anything up and feeling fatigue. Patient states last Tuesday was worse with fever and sweating and his clothes were soak. Also, patient did not take his temperature, not he is feeling better and he thinks it has passed.

## 2019-07-15 DIAGNOSIS — R69 Illness, unspecified: Secondary | ICD-10-CM | POA: Diagnosis not present

## 2019-07-16 DIAGNOSIS — R69 Illness, unspecified: Secondary | ICD-10-CM | POA: Diagnosis not present

## 2019-07-20 DIAGNOSIS — R69 Illness, unspecified: Secondary | ICD-10-CM | POA: Diagnosis not present

## 2019-11-25 ENCOUNTER — Ambulatory Visit (INDEPENDENT_AMBULATORY_CARE_PROVIDER_SITE_OTHER): Payer: Medicare HMO | Admitting: Emergency Medicine

## 2019-11-25 VITALS — BP 130/70 | Ht 73.0 in | Wt 170.0 lb

## 2019-11-25 DIAGNOSIS — Z0000 Encounter for general adult medical examination without abnormal findings: Secondary | ICD-10-CM | POA: Diagnosis not present

## 2019-11-25 NOTE — Patient Instructions (Signed)
Thank you for taking time to come for your Medicare Wellness Visit. I appreciate your ongoing commitment to your health goals. Please review the following plan we discussed and let me know if I can assist you in the future.  Jerome Kennedy LPN  Preventive Care 64 Years and Older, Male Preventive care refers to lifestyle choices and visits with your health care provider that can promote health and wellness. This includes:  A yearly physical exam. This is also called an annual well check.  Regular dental and eye exams.  Immunizations.  Screening for certain conditions.  Healthy lifestyle choices, such as diet and exercise. What can I expect for my preventive care visit? Physical exam Your health care provider will check:  Height and weight. These may be used to calculate body mass index (BMI), which is a measurement that tells if you are at a healthy weight.  Heart rate and blood pressure.  Your skin for abnormal spots. Counseling Your health care provider may ask you questions about:  Alcohol, tobacco, and drug use.  Emotional well-being.  Home and relationship well-being.  Sexual activity.  Eating habits.  History of falls.  Memory and ability to understand (cognition).  Work and work Statistician. What immunizations do I need?  Influenza (flu) vaccine  This is recommended every year. Tetanus, diphtheria, and pertussis (Tdap) vaccine  You may need a Td booster every 10 years. Varicella (chickenpox) vaccine  You may need this vaccine if you have not already been vaccinated. Zoster (shingles) vaccine  You may need this after age 66. Pneumococcal conjugate (PCV13) vaccine  One dose is recommended after age 84. Pneumococcal polysaccharide (PPSV23) vaccine  One dose is recommended after age 88. Measles, mumps, and rubella (MMR) vaccine  You may need at least one dose of MMR if you were born in 1957 or later. You may also need a second dose. Meningococcal  conjugate (MenACWY) vaccine  You may need this if you have certain conditions. Hepatitis A vaccine  You may need this if you have certain conditions or if you travel or work in places where you may be exposed to hepatitis A. Hepatitis B vaccine  You may need this if you have certain conditions or if you travel or work in places where you may be exposed to hepatitis B. Haemophilus influenzae type b (Hib) vaccine  You may need this if you have certain conditions. You may receive vaccines as individual doses or as more than one vaccine together in one shot (combination vaccines). Talk with your health care provider about the risks and benefits of combination vaccines. What tests do I need? Blood tests  Lipid and cholesterol levels. These may be checked every 5 years, or more frequently depending on your overall health.  Hepatitis C test.  Hepatitis B test. Screening  Lung cancer screening. You may have this screening every year starting at age 24 if you have a 30-pack-year history of smoking and currently smoke or have quit within the past 15 years.  Colorectal cancer screening. All adults should have this screening starting at age 52 and continuing until age 61. Your health care provider may recommend screening at age 57 if you are at increased risk. You will have tests every 1-10 years, depending on your results and the type of screening test.  Prostate cancer screening. Recommendations will vary depending on your family history and other risks.  Diabetes screening. This is done by checking your blood sugar (glucose) after you have not eaten for  a while (fasting). You may have this done every 1-3 years.  Abdominal aortic aneurysm (AAA) screening. You may need this if you are a current or former smoker.  Sexually transmitted disease (STD) testing. Follow these instructions at home: Eating and drinking  Eat a diet that includes fresh fruits and vegetables, whole grains, lean  protein, and low-fat dairy products. Limit your intake of foods with high amounts of sugar, saturated fats, and salt.  Take vitamin and mineral supplements as recommended by your health care provider.  Do not drink alcohol if your health care provider tells you not to drink.  If you drink alcohol: ? Limit how much you have to 0-2 drinks a day. ? Be aware of how much alcohol is in your drink. In the [place] one drink equals one 12 oz bottle of beer (355 mL), one 5 oz glass of wine (148 mL), or one 1 oz glass of hard liquor (44 mL). Lifestyle  Take daily care of your teeth and gums.  Stay active. Exercise for at least 30 minutes on 5 or more days each week.  Do not use any products that contain nicotine or tobacco, such as cigarettes, e-cigarettes, and chewing tobacco. If you need help quitting, ask your health care provider.  If you are sexually active, practice safe sex. Use a condom or other form of protection to prevent STIs (sexually transmitted infections).  Talk with your health care provider about taking a low-dose aspirin or statin. What's next?  Visit your health care provider once a year for a well check visit.  Ask your health care provider how often you should have your eyes and teeth checked.  Stay up to date on all vaccines. This information is not intended to replace advice given to you by your health care provider. Make sure you discuss any questions you have with your health care provider. Document Revised: 10/16/2018 Document Reviewed: 10/16/2018 Elsevier Patient Education  2020 Elsevier Inc.  

## 2019-11-25 NOTE — Progress Notes (Addendum)
Presents today for [employer] Visit   Date of last exam: 07-14-2019  Interpreter used for this visit  No  I connected with  Juluis Rainier on 11/25/19 by a telephone and verified that I am speaking with the correct person using two identifiers.   I discussed the limitations of evaluation and management by telemedicine. The patient expressed understanding and agreed to proceed.   Patient Care Team: Patient, No Pcp Per as PCP - General (General Practice)   Discussed Eye/Dental Discussed immunizations    ADVANCE DIRECTIVES: Discussed: yes On File: no Materials Provided:yes  Immunization status:  Immunization History  Administered Date(s) Administered  . Pneumococcal Conjugate-13 05/28/2018  . Tdap 05/28/2018     There are no preventive care reminders to display for this patient.   Functional Status Survey: Is the patient deaf or have difficulty hearing?: No Does the patient have difficulty seeing, even when wearing glasses/contacts?: No Does the patient have difficulty concentrating, remembering, or making decisions?: No Does the patient have difficulty walking or climbing stairs?: No Does the patient have difficulty dressing or bathing?: No Does the patient have difficulty doing errands alone such as visiting a doctor's office or shopping?: No   6CIT Screen 11/25/2019  What Year? 0 points  What month? 0 points  What time? 0 points  Count back from 20 0 points  Months in reverse 0 points  Repeat phrase 0 points  Total Score 0        Clinical Support from 11/25/2019 in [place] at [place]  AUDIT-C Score  2       Home Environment:   Lives one story with a finished basement No trouble climbing stairs No scattered rugs No grab bars Adequate lighting/no clutter   Patient Active Problem List   Diagnosis Date Noted  . History of diverticulosis 11/17/2018  . Coronary artery calcification seen on CAT scan 11/30/2015  . History of  tobacco abuse 11/30/2015  . Malignant neoplasm of upper lobe of left lung ([place]) 12/13/2014  . Lung cancer ([place]) 12/10/2012     Past Medical History:  Diagnosis Date  . Cancer ([place]) lung ca    dx'd 2008     Past Surgical History:  Procedure Laterality Date  . COLONOSCOPY    . EYE MUSCLE SURGERY Right   . LUNG REMOVAL, PARTIAL       Family History  Problem Relation Age of Onset  . Lung cancer Father   . Heart attack Neg Hx   . Hypertension Neg Hx   . Stroke Neg Hx   . Colon cancer Neg Hx   . Esophageal cancer Neg Hx   . Pancreatic cancer Neg Hx   . Rectal cancer Neg Hx   . Stomach cancer Neg Hx      Social History   Socioeconomic History  . Marital status: Married    Spouse name: Not on file  . Number of children: Not on file  . Years of education: Not on file  . Highest education level: Not on file  Occupational History  . Not on file  Tobacco Use  . Smoking status: Former Smoker    Quit date: 11/05/1998    Years since quitting: 21.0  . Smokeless tobacco: Never Used  Substance and Sexual Activity  . Alcohol use: Yes    Alcohol/week: 0.0 standard drinks    Comment: occasional beer  . Drug use: No  . Sexual activity: Not on file  Other Topics  Concern  . Not on file  Social History Narrative  . Not on file   Social Determinants of Health   Financial Resource Strain:   . Difficulty of Paying Living Expenses: Not on file  Food Insecurity:   . Worried About Charity fundraiser in the Last Year: Not on file  . Ran Out of Food in the Last Year: Not on file  Transportation Needs:   . Lack of Transportation (Medical): Not on file  . Lack of Transportation (Non-Medical): Not on file  Physical Activity:   . Days of Exercise per Week: Not on file  . Minutes of Exercise per Session: Not on file  Stress:   . Feeling of Stress : Not on file  Social Connections:   . Frequency of Communication with Friends and Family: Not on file  . Frequency of Social  Gatherings with Friends and Family: Not on file  . Attends Religious Services: Not on file  . Active Member of Clubs or Organizations: Not on file  . Attends Archivist Meetings: Not on file  . Marital Status: Not on file  Intimate Partner Violence:   . Fear of Current or Ex-Partner: Not on file  . Emotionally Abused: Not on file  . Physically Abused: Not on file  . Sexually Abused: Not on file     No Known Allergies   Prior to Admission medications   Medication Sig Start Date End Date Taking? Authorizing Provider  hydrocortisone (ANUSOL-HC) 2.5 % rectal cream Apply a pea size amount onto a rectal suppository and insert in the rectum. Do this twice daily. Patient not taking: Reported on 07/14/2019 11/20/18   Levin Erp, [place]     Depression screen [facility] 2/9 11/25/2019 07/14/2019 11/17/2018 06/12/2018 05/28/2018  Decreased Interest 0 0 0 0 0  Down, Depressed, Hopeless 0 0 0 0 0  PHQ - 2 Score 0 0 0 0 0     Fall Risk  11/25/2019 07/14/2019 11/17/2018 06/12/2018 05/28/2018  Falls in the past year? 0 1 0 Yes Yes  Number falls in past yr: 0 0 - 1 1  Injury with Fall? 0 1 - Yes Yes  Comment - fractured right ribs - rib fx right elbow and ribs  Follow up Falls evaluation completed;Education provided - - - -      PHYSICAL EXAM: BP 130/70 Comment: taken from a previous visit  Ht 6\' 1"  (1.854 m)   Wt 170 lb (77.1 kg)   BMI 22.43 kg/m    Wt Readings from Last 3 Encounters:  11/25/19 170 lb (77.1 kg)  07/14/19 170 lb (77.1 kg)  11/20/18 170 lb (77.1 kg)      Education/Counseling provided regarding diet and exercise, prevention of chronic diseases, smoking/tobacco cessation, if applicable, and reviewed "Covered Medicare Preventive Services."   I have reviewed and agree with the above AWV documentation. Agustina Caroli, MD

## 2019-11-26 DIAGNOSIS — R69 Illness, unspecified: Secondary | ICD-10-CM | POA: Diagnosis not present

## 2020-01-02 ENCOUNTER — Ambulatory Visit: Payer: Medicare HMO | Attending: Internal Medicine

## 2020-01-02 DIAGNOSIS — Z23 Encounter for immunization: Secondary | ICD-10-CM

## 2020-01-02 NOTE — Progress Notes (Signed)
   Covid-19 Vaccination Clinic  Name:  Jerome Price    MRN: [id number] DOB: 09-21-1951  01/02/2020  Mr. Delpilar was observed post Covid-19 immunization for 15 minutes without incidence. He was provided with Vaccine Information Sheet and instruction to access the V-Safe system.   Mr. Borman was instructed to call 911 with any severe reactions post vaccine: Marland Kitchen Difficulty breathing  . Swelling of your face and throat  . A fast heartbeat  . A bad rash all over your body  . Dizziness and weakness    Immunizations Administered    Name Date Dose VIS Date Route   Moderna COVID-19 Vaccine 01/02/2020  2:55 PM 0.5 mL 10/06/2019 Intramuscular   Manufacturer: Moderna   Lot: [id number]   [place]: [id number]

## 2020-01-30 ENCOUNTER — Ambulatory Visit: Payer: Medicare HMO | Attending: Internal Medicine

## 2020-01-30 DIAGNOSIS — Z23 Encounter for immunization: Secondary | ICD-10-CM

## 2020-01-30 NOTE — Progress Notes (Signed)
   Covid-19 Vaccination Clinic  Name:  JARAY BOLIVER    MRN: [id number] DOB: 1951-01-10  01/30/2020  Mr. Cass was observed post Covid-19 immunization for 15 minutes without incident. He was provided with Vaccine Information Sheet and instruction to access the V-Safe system.   Mr. Friedel was instructed to call 911 with any severe reactions post vaccine: Marland Kitchen Difficulty breathing  . Swelling of face and throat  . A fast heartbeat  . A bad rash all over body  . Dizziness and weakness   Immunizations Administered    Name Date Dose VIS Date Route   Moderna COVID-19 Vaccine 01/30/2020 11:45 AM 0.5 mL 10/06/2019 Intramuscular   Manufacturer: Levan Hurst   Lot: [id number]   [place]: [id number]

## 2020-02-15 DIAGNOSIS — H538 Other visual disturbances: Secondary | ICD-10-CM | POA: Diagnosis not present

## 2020-02-15 DIAGNOSIS — H2513 Age-related nuclear cataract, bilateral: Secondary | ICD-10-CM | POA: Diagnosis not present

## 2020-02-15 DIAGNOSIS — H53031 Strabismic amblyopia, right eye: Secondary | ICD-10-CM | POA: Diagnosis not present

## 2020-02-29 DIAGNOSIS — Z0100 Encounter for examination of eyes and vision without abnormal findings: Secondary | ICD-10-CM | POA: Diagnosis not present

## 2020-09-20 DIAGNOSIS — R69 Illness, unspecified: Secondary | ICD-10-CM | POA: Diagnosis not present

## 2021-04-08 ENCOUNTER — Other Ambulatory Visit: Payer: Self-pay

## 2021-04-08 ENCOUNTER — Ambulatory Visit
Admission: RE | Admit: 2021-04-08 | Discharge: 2021-04-08 | Disposition: A | Discharge status: Home / Self Care | Payer: Medicare HMO | Source: Ambulatory Visit | Attending: Urgent Care | Admitting: Urgent Care

## 2021-04-08 VITALS — BP 146/77 | HR 80 | Temp 98.5°F | Resp 16

## 2021-04-08 DIAGNOSIS — W57XXXA Bitten or stung by nonvenomous insect and other nonvenomous arthropods, initial encounter: Secondary | ICD-10-CM

## 2021-04-08 DIAGNOSIS — S1086XA Insect bite of other specified part of neck, initial encounter: Secondary | ICD-10-CM

## 2021-04-08 DIAGNOSIS — R590 Localized enlarged lymph nodes: Secondary | ICD-10-CM | POA: Diagnosis not present

## 2021-04-08 MED ORDER — DOXYCYCLINE HYCLATE 100 MG PO CAPS: 200.0000 mg | ORAL_CAPSULE | Freq: Once | ORAL | 0 refills | Status: AC

## 2021-04-08 NOTE — ED Provider Notes (Signed)
[facility]   MRN: [id number] DOB: Sep 15, 1951  Subjective:   Jerome Price is a 70 y.o. male presenting for evaluation of a nodule on the left side of his neck.  Patient reports that he removed a tick from the left side of his neck about a week ago.  Few days later he developed a knot just in front of that.  Denies any pain, fever, headache, red eyes, sore throat, cough, body aches, chest pain, shortness of breath, nausea, vomiting, abdominal pain.  No current facility-administered medications for this encounter.  Current Outpatient Medications:  .  hydrocortisone (ANUSOL-HC) 2.5 % rectal cream, Apply a pea size amount onto a rectal suppository and insert in the rectum. Do this twice daily. (Patient not taking: Reported on 07/14/2019), Disp: 30 g, Rfl: 1   No Known Allergies  Past Medical History:  Diagnosis Date  . Cancer ([place]) lung ca    dx'd 2008     Past Surgical History:  Procedure Laterality Date  . COLONOSCOPY    . EYE MUSCLE SURGERY Right   . LUNG REMOVAL, PARTIAL      Family History  Problem Relation Age of Onset  . Lung cancer Father   . Heart attack Neg Hx   . Hypertension Neg Hx   . Stroke Neg Hx   . Colon cancer Neg Hx   . Esophageal cancer Neg Hx   . Pancreatic cancer Neg Hx   . Rectal cancer Neg Hx   . Stomach cancer Neg Hx     Social History   Tobacco Use  . Smoking status: Former Smoker    Quit date: 11/05/1998    Years since quitting: 22.4  . Smokeless tobacco: Never Used  Vaping Use  . Vaping Use: Never used  Substance Use Topics  . Alcohol use: Yes    Alcohol/week: 0.0 standard drinks    Comment: occasional beer  . Drug use: No    ROS   Objective:   Vitals: BP (!) 146/77 (BP Location: Right Arm)   Pulse 80   Temp 98.5 F (36.9 C) (Oral)   Resp 16   SpO2 95%   Physical Exam Constitutional:      General: He is not in acute distress.    Appearance: Normal appearance. He is well-developed. He is not  ill-appearing, toxic-appearing or diaphoretic.  HENT:     Head: Normocephalic and atraumatic.     Right Ear: External ear normal.     Left Ear: External ear normal.     Nose: Nose normal.     Mouth/Throat:     Mouth: Mucous membranes are moist.     Pharynx: Oropharynx is clear.  Eyes:     General: No scleral icterus.    Extraocular Movements: Extraocular movements intact.     Pupils: Pupils are equal, round, and reactive to light.  Cardiovascular:     Rate and Rhythm: Normal rate and regular rhythm.     Heart sounds: Normal heart sounds. No murmur heard. No friction rub. No gallop.   Pulmonary:     Effort: Pulmonary effort is normal. No respiratory distress.     Breath sounds: Normal breath sounds. No stridor. No wheezing, rhonchi or rales.  Chest:  Breasts:     Right: No axillary adenopathy or supraclavicular adenopathy.     Left: No axillary adenopathy or supraclavicular adenopathy.    Lymphadenopathy:     Head:     Right side of head: No  submental, submandibular, tonsillar, preauricular, posterior auricular or occipital adenopathy.     Left side of head: No submental, submandibular, tonsillar, preauricular, posterior auricular or occipital adenopathy.     Cervical: Cervical adenopathy present.     Right cervical: No superficial, deep or posterior cervical adenopathy.    Left cervical: Superficial cervical adenopathy present. No deep or posterior cervical adenopathy.     Upper Body:     Right upper body: No supraclavicular, axillary, pectoral or epitrochlear adenopathy.     Left upper body: No supraclavicular, axillary, pectoral or epitrochlear adenopathy.  Neurological:     Mental Status: He is alert and oriented to person, place, and time.  Psychiatric:        Mood and Affect: Mood normal.        Behavior: Behavior normal.        Thought Content: Thought content normal.     Assessment and Plan :   PDMP not reviewed this encounter.  1. Lymphadenopathy, anterior  cervical   2. Tick bite of other part of neck, initial encounter     Recommended tick bite prophylaxis with doxycycline at 200mg  once.  Patient declined any lab studies.  Recommend supportive care otherwise.  Follow-up with PCP. Counseled patient on potential for adverse effects with medications prescribed/recommended today, ER and return-to-clinic precautions discussed, patient verbalized understanding.    Jaynee Eagles, PA-C 04/08/21 1214

## 2021-04-08 NOTE — ED Triage Notes (Signed)
Pt removed a tick x 1 week on his neck and a few days later notice a small area in front of tick bite that was swollen and a little sore. No nausea, no vomiting.

## 2021-07-04 ENCOUNTER — Other Ambulatory Visit: Payer: Self-pay

## 2021-07-04 ENCOUNTER — Ambulatory Visit
Admission: RE | Admit: 2021-07-04 | Discharge: 2021-07-04 | Disposition: A | Discharge status: Home / Self Care | Payer: Medicare HMO | Source: Ambulatory Visit | Attending: Emergency Medicine | Admitting: Emergency Medicine

## 2021-07-04 VITALS — BP 133/83 | HR 79 | Temp 98.6°F | Resp 16

## 2021-07-04 DIAGNOSIS — S39012A Strain of muscle, fascia and tendon of lower back, initial encounter: Secondary | ICD-10-CM | POA: Diagnosis not present

## 2021-07-04 MED ORDER — IBUPROFEN 600 MG PO TABS: 600.0000 mg | ORAL_TABLET | Freq: Four times a day (QID) | ORAL | 0 refills | Status: DC | PRN

## 2021-07-04 NOTE — ED Provider Notes (Signed)
HPI  SUBJECTIVE:  Jerome Price is a 70 y.o. male who presents with left low back/posterior pelvic pain after having a trip and fall off of a curb 5 days ago.  States that he fell directly onto concrete.  No direct trauma to the spine.  He denies head injury.  He states that he is getting better overall.  He has been ambulatory without any problem.  No saddle anesthesia, urinary fecal incontinence, urinary retention, bilateral radicular pain, distal weakness, numbness or tingling in the left leg, bruising, swelling, left hip pain.  He has tried ibuprofen and Biofreeze with improvement in symptoms.  Symptoms are worse with going from getting up from sitting, bending forward, picking things up.  No antipyretic in the past 6 hours.  He has a past medical history of lung cancer in 2008.  He is a former smoker, quit years ago.  No history of osteoporosis, chronic kidney disease, anticoagulant/antiplatelet use.  PMD: Dr. Mitchel Honour   Past Medical History:  Diagnosis Date   Cancer [facility]) lung ca    dx'd 2008    Past Surgical History:  Procedure Laterality Date   COLONOSCOPY     EYE MUSCLE SURGERY Right    LUNG REMOVAL, PARTIAL      Family History  Problem Relation Age of Onset   Lung cancer Father    Heart attack Neg Hx    Hypertension Neg Hx    Stroke Neg Hx    Colon cancer Neg Hx    Esophageal cancer Neg Hx    Pancreatic cancer Neg Hx    Rectal cancer Neg Hx    Stomach cancer Neg Hx     Social History   Tobacco Use   Smoking status: Former    Types: Cigarettes    Quit date: 11/05/1998    Years since quitting: 22.6   Smokeless tobacco: Never  Vaping Use   Vaping Use: Never used  Substance Use Topics   Alcohol use: Yes    Alcohol/week: 0.0 standard drinks    Comment: occasional beer   Drug use: No    No current facility-administered medications for this encounter.  Current Outpatient Medications:    ibuprofen (ADVIL) 600 MG tablet, Take 1 tablet (600 mg total) by mouth every  6 (six) hours as needed., Disp: 30 tablet, Rfl: 0  No Known Allergies   ROS  As noted in HPI.   Physical Exam  BP 133/83 (BP Location: Left Arm)   Pulse 79   Temp 98.6 F (37 C) (Oral)   Resp 16   SpO2 96%   Constitutional: Well developed, well nourished, no acute distress Eyes:  EOMI, conjunctiva normal bilaterally HENT: Normocephalic, atraumatic,mucus membranes moist Respiratory: Normal inspiratory effort Cardiovascular: Normal rate GI: nondistended. No suprapubic tenderness skin: No rash, skin intact Musculoskeletal: No L-spine, SI joint, or coccygeal tenderness.  No tenderness along the left PSIS, buttock, lateral hip.  No bruising of the back or buttock..  No paralumbar tenderness, no muscle spasm. Bilateral lower extremities nontender, baseline ROM intact.  intact PT pulses .No pain with int/ext rotation flex/extension hips bilaterally. SLR neg bilaterally. Sensation baseline light touch bilaterally for Pt, DTR's symmetric and intact bilaterally KJ / Motor symmetric bilateral 5/5 hip flexion, quadriceps, hamstrings, EHL, foot dorsiflexion, foot plantarflexion, gait normal.  Pain elicited with going from sitting to lying and lying to sitting. Neurologic: Alert & oriented x 3, no focal neuro deficits Psychiatric: Speech and behavior appropriate   ED Course   Medications -  No data to display  No orders of the defined types were placed in this encounter.   No results found for this or any previous visit (from the past 24 hour(s)). No results found.  ED Clinical Impression  1. Strain of lumbar region, initial encounter     ED Assessment/Plan   Patient with a low back strain.  There is no bony tenderness on complete exam.  No ecchymosis.  No evidence of spinal cord involvement based on H&P.  No historical red flags as noted in HPI. No physical red flags such as bony tenderness, lower extremity weakness, saddle anesthesia.  Do not think that imaging would be  particularly helpful, deferring it today.  Home with Tylenol/ibuprofen, continue Biofreeze.  Ice, stretching, whichever feels better.  We will have him follow-up with Dr. Mitchel Honour, and will provide primary care list for ongoing care.  Discussed signs and symptoms that should prompt return to the emergency department.  Patient agrees with plan.   Meds ordered this encounter  Medications   ibuprofen (ADVIL) 600 MG tablet    Sig: Take 1 tablet (600 mg total) by mouth every 6 (six) hours as needed.    Dispense:  30 tablet    Refill:  0    *This clinic note was created using Lobby[employer]. Therefore, there may be occasional mistakes despite careful proofreading.  ?     Melynda Ripple, MD 07/05/21 [phone]

## 2021-07-04 NOTE — Discharge Instructions (Addendum)
Gentle stretching, ice or heat, whichever feels better, 1000 mg of Tylenol combined with 600 mg of ibuprofen together 3-4 times a day as needed for pain, continue Biofreeze.  Below is a list of primary care practices who are taking new patients for you to follow-up with.  [facility] internal medicine clinic Ground Floor - [facility], [place], [place], [place] [address] [phone]  [facility] Primary Care at [facility] [address] [place], [place] [address] [phone]  [facility] [place],  [address] [phone]  [facility]Internal Medicine [phone] [place] [address]  [facility]: [place]  [phone]  [facility]: [address] [place]  [phone]  [facility] primary care : [address] [phone]  [facility] Primary Care: [address] [place] [address] [phone]  [facility]: [place] [phone]  Dr. Blanchie Serve [place] [address]  [phone]  Go to www.goodrx.com  or www.costplusdrugs.com to look up your medications. This will give you a list of where you can find your prescriptions at the most affordable prices. Or ask the pharmacist what the cash price is, or if they have any other discount programs available to help make your medication more affordable. This can be less expensive than what you would pay with insurance.

## 2021-07-04 NOTE — ED Triage Notes (Signed)
Patient presents to Urgent Care with complaints of back pain since  Saturday. Pt states he tripped and fell landing on his back. Treating pain with ibuprofen and freeze spray.   Denies head injury with fall.

## 2021-11-16 DIAGNOSIS — H538 Other visual disturbances: Secondary | ICD-10-CM | POA: Diagnosis not present

## 2021-11-16 DIAGNOSIS — H53031 Strabismic amblyopia, right eye: Secondary | ICD-10-CM | POA: Diagnosis not present

## 2021-11-16 DIAGNOSIS — Z0100 Encounter for examination of eyes and vision without abnormal findings: Secondary | ICD-10-CM | POA: Diagnosis not present

## 2022-03-06 ENCOUNTER — Telehealth: Payer: Medicare HMO | Admitting: Physician Assistant

## 2022-03-06 DIAGNOSIS — R42 Dizziness and giddiness: Secondary | ICD-10-CM | POA: Diagnosis not present

## 2022-03-06 NOTE — Patient Instructions (Signed)
?  Jerome Price, thank you for joining Leeanne Rio, PA-C for today's virtual visit.  While this provider is not your primary care provider (PCP), if your PCP is located in our provider database this encounter information will be shared with them immediately following your visit. ? ?Consent: ?(Patient) Jerome Price provided verbal consent for this virtual visit at the beginning of the encounter. ? ?Current Medications: ?No current outpatient medications on file.  ? ?Medications ordered in this encounter:  ?No orders of the defined types were placed in this encounter. ?  ? ?*If you need refills on other medications prior to your next appointment, please contact your pharmacy* ? ?Follow-Up: ?Call back or seek an in-person evaluation if the symptoms worsen or if the condition fails to improve as anticipated. ? ?Other Instructions ?Please use links below to get an evaluation today at local Urgent Care and to get scheduled with a  primary care provider for routine physicals, sick visits and in case further workup is needed.  ? ? ?If you have been instructed to have an in-person evaluation today at a local Urgent Care facility, please use the link below. It will take you to a list of all of our available [facility], including address, phone number and hours of operation. Please do not delay care.  ?[facility] ? ?If you or a family member do not have a primary care provider, use the link below to schedule a visit and establish care. When you choose a [facility] primary care physician or advanced practice provider, you gain a long-term partner in health. ?Find a Primary Care Provider ? ?Learn more about [facility]'s in-office and virtual care options: ?[facility]  ?

## 2022-03-06 NOTE — Progress Notes (Signed)
?Virtual Visit Consent  ? ?Juluis Rainier, you are scheduled for a virtual visit with a [facility] provider today.   ?  ?Just as with appointments in the office, your consent must be obtained to participate.  Your consent will be active for this visit and any virtual visit you may have with one of our providers in the next 365 days.   ?  ?If you have a MyChart account, a copy of this consent can be sent to you electronically.  All virtual visits are billed to your insurance company just like a traditional visit in the office.   ? ?As this is a virtual visit, video technology does not allow for your provider to perform a traditional examination.  This may limit your provider's ability to fully assess your condition.  If your provider identifies any concerns that need to be evaluated in person or the need to arrange testing (such as labs, EKG, etc.), we will make arrangements to do so.   ?  ?Although advances in technology are sophisticated, we cannot ensure that it will always work on either your end or our end.  If the connection with a video visit is poor, the visit may have to be switched to a telephone visit.  With either a video or telephone visit, we are not always able to ensure that we have a secure connection.   ? ?Also, by engaging in this virtual visit, you consent to the provision of healthcare. Additionally, you authorize for your insurance to be billed (if applicable) for the services provided during this visit. I also discussed with the patient that there may be a patient responsible charge related to this service. ? ?I need to obtain your verbal consent now.   Are you willing to proceed with your visit today?  ?  ?Jerome Price has provided verbal consent on 03/06/2022 for a virtual visit (video or telephone). ?  ?Leeanne Rio, PA-C  ? ?Date: 03/06/2022 11:39 AM ? ? ?Virtual Visit via Video Note  ? ?ILeeanne Rio, connected with  Jerome Price  ([id number], [birth year]) on 03/06/22 at  11:15 AM EDT by a video-enabled telemedicine application and verified that I am speaking with the correct person using two identifiers. ? ?Location: ?Patient: Virtual Visit Location Patient: Home ?Provider: Virtual Visit Location Provider: Home Office ?  ?I discussed the limitations of evaluation and management by telemedicine and the availability of in person appointments. The patient expressed understanding and agreed to proceed.   ? ?History of Present Illness: ?Jerome Price is a 71 y.o. who identifies as a male who was assigned male at birth, and is being seen today for episodes of lightheadedness while at the gym noted over the past few weeks. States he stays very active, going to the gym every morning. Notes he has been in great health and takes no medications. Notes his heart rate while working out is usually in the 70s. Notes recently having a couple of episodes where he feels slightly lightheaded and off-balance while working out. Notes this are very short-lived and he can push through them. Notes his heart rate monitor during these times will be up in the 90s and he can feel his heart racing a bit. Denies any chest pain or shortness of breath. Denies true vertigo. Tried to stay well-hydrated and eat a well-balanced diet. Is asymptomatic currently but wanted to know if this was something he should worry with.   ?HPI: HPI  ?Problems:  ?  Patient Active Problem List  ? Diagnosis Date Noted  ? History of diverticulosis 11/17/2018  ? Coronary artery calcification seen on CAT scan 11/30/2015  ? History of tobacco abuse 11/30/2015  ? 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 Lung cancer ([place]) 12/10/2012  ?  ?Allergies: No Known Allergies ?Medications:  ?Current Outpatient Medications:  ?  ibuprofen (ADVIL) 600 MG tablet, Take 1 tablet (600 mg total) by mouth every 6 (six) hours as needed., Disp: 30 tablet, Rfl: 0 ? ?Observations/Objective: ?Patient is well-developed, well-nourished in no  acute distress.  ?Resting comfortably at home.  ?Head is normocephalic, atraumatic.  ?No labored breathing. ?Speech is clear and coherent with logical content.  ?Patient is alert and oriented at baseline.  ? ?Assessment and Plan: ?1. Episodic lightheadedness ? ?Exertional. Unsure if potentially orthostatic in nature or related to underlying electrolyte disturbance, glucose or cardiac etiology. Most likely orthostatic or vasovagal but needs further evaluation. Is without PCP currently. Since he is asymptomatic discussed he can at least have evaluation at [facility] for heart exam, orthostatic vitals ans baseline EKG. Link for PCP scheduling given as well. ER precautions reviewed.  ? ?Follow Up Instructions: ?I discussed the assessment and treatment plan with the patient. The patient was provided an opportunity to ask questions and all were answered. The patient agreed with the plan and demonstrated an understanding of the instructions.  A copy of instructions were sent to the patient via MyChart unless otherwise noted below.  ? ?The patient was advised to call back or seek an in-person evaluation if the symptoms worsen or if the condition fails to improve as anticipated. ? ?Time:  ?I spent 8 minutes with the patient via telehealth technology discussing the above problems/concerns.   ? ?Leeanne Rio, PA-C ?

## 2022-11-15 DIAGNOSIS — H53031 Strabismic amblyopia, right eye: Secondary | ICD-10-CM | POA: Diagnosis not present

## 2022-11-15 DIAGNOSIS — R69 Illness, unspecified: Secondary | ICD-10-CM | POA: Diagnosis not present

## 2023-01-10 DIAGNOSIS — R69 Illness, unspecified: Secondary | ICD-10-CM | POA: Diagnosis not present

## 2023-03-09 ENCOUNTER — Telehealth: Payer: Medicare HMO | Admitting: Physician Assistant

## 2023-03-09 DIAGNOSIS — M25561 Pain in right knee: Secondary | ICD-10-CM

## 2023-03-09 MED ORDER — PREDNISONE 20 MG PO TABS: 40.0000 mg | ORAL_TABLET | Freq: Every day | ORAL | 0 refills | Status: AC

## 2023-03-09 NOTE — Progress Notes (Signed)
Virtual Visit Consent   VY HAYS, you are scheduled for a virtual visit with a [facility] provider today. Just as with appointments in the office, your consent must be obtained to participate. Your consent will be active for this visit and any virtual visit you may have with one of our providers in the next 365 days. If you have a MyChart account, a copy of this consent can be sent to you electronically.  As this is a virtual visit, video technology does not allow for your provider to perform a traditional examination. This may limit your provider's ability to fully assess your condition. If your provider identifies any concerns that need to be evaluated in person or the need to arrange testing (such as labs, EKG, etc.), we will make arrangements to do so. Although advances in technology are sophisticated, we cannot ensure that it will always work on either your end or our end. If the connection with a video visit is poor, the visit may have to be switched to a telephone visit. With either a video or telephone visit, we are not always able to ensure that we have a secure connection.  By engaging in this virtual visit, you consent to the provision of healthcare and authorize for your insurance to be billed (if applicable) for the services provided during this visit. Depending on your insurance coverage, you may receive a charge related to this service.  I need to obtain your verbal consent now. Are you willing to proceed with your visit today? Jerome Price has provided verbal consent on 03/09/2023 for a virtual visit (video or telephone). Tylene Fantasia Ward, PA-C  Date: 03/09/2023 10:40 AM  Virtual Visit via Video Note   I, Tylene Fantasia Ward, connected with  Jerome Price  ([id number], [birth year]) on 03/09/23 at 10:30 AM EDT by a video-enabled telemedicine application and verified that I am speaking with the correct person using two identifiers.  Location: Patient: Virtual Visit Location Patient:  Home Provider: Virtual Visit Location Provider: Home   I discussed the limitations of evaluation and management by telemedicine and the availability of in person appointments. The patient expressed understanding and agreed to proceed.    History of Present Illness: Jerome Price is a 72 y.o. who identifies as a male who was assigned male at birth, and is being seen today for right knee pain.  Reports he hit the knee last week, he doesn't remember how.  He has taken advil and applied ice which helped temporarily.  He denies problems with this knee in the past.  He reports pain is worse when he stands from a seated position, but then gradually improves. He is very active, goes to the gym regularly.  Denies redness, swelling, or bruising.   HPI: HPI  Problems:  Patient Active Problem List   Diagnosis Date Noted   History of diverticulosis 11/17/2018   Coronary artery calcification seen on CAT scan 11/30/2015   History of tobacco abuse 11/30/2015   Malignant neoplasm of upper lobe of left lung (HCC) 12/13/2014   Lung cancer (HCC) 12/10/2012    Allergies: No Known Allergies Medications:  Current Outpatient Medications:    predniSONE (DELTASONE) 20 MG tablet, Take 2 tablets (40 mg total) by mouth daily with breakfast for 5 days., Disp: 10 tablet, Rfl: 0  Observations/Objective: Patient is well-developed, well-nourished in no acute distress.  Resting comfortably at home.  Head is normocephalic, atraumatic.  No labored breathing.  Speech is clear and coherent  with logical content.  Patient is alert and oriented at baseline.    Assessment and Plan: 1. Acute pain of right knee - predniSONE (DELTASONE) 20 MG tablet; Take 2 tablets (40 mg total) by mouth daily with breakfast for 5 days.  Dispense: 10 tablet; Refill: 0  Supportive care discussed.  Advised follow up for in person evaluation if no improvement.  No redness or bruising or swelling, no indication of gout or fracture.   Follow Up  Instructions: I discussed the assessment and treatment plan with the patient. The patient was provided an opportunity to ask questions and all were answered. The patient agreed with the plan and demonstrated an understanding of the instructions.  A copy of instructions were sent to the patient via MyChart unless otherwise noted below.     The patient was advised to call back or seek an in-person evaluation if the symptoms worsen or if the condition fails to improve as anticipated.  Time:  I spent 10 minutes with the patient via telehealth technology discussing the above problems/concerns.    Tylene Fantasia Ward, PA-C

## 2023-03-09 NOTE — Patient Instructions (Signed)
  Jerome Price, thank you for joining Tylene Fantasia Ward, PA-C for today's virtual visit.  While this provider is not your primary care provider (PCP), if your PCP is located in our provider database this encounter information will be shared with them immediately following your visit.   A [facility] MyChart account gives you access to today's visit and all your visits, tests, and labs performed at [facility] " click here if you don't have a [facility] MyChart account or go to [email or web address]  Consent: (Patient) Jerome Price provided verbal consent for this virtual visit at the beginning of the encounter.  Current Medications:  Current Outpatient Medications:    predniSONE (DELTASONE) 20 MG tablet, Take 2 tablets (40 mg total) by mouth daily with breakfast for 5 days., Disp: 10 tablet, Rfl: 0   Medications ordered in this encounter:  Meds ordered this encounter  Medications   predniSONE (DELTASONE) 20 MG tablet    Sig: Take 2 tablets (40 mg total) by mouth daily with breakfast for 5 days.    Dispense:  10 tablet    Refill:  0    Order Specific Question:   Supervising Provider    Answer:   Merrilee Jansky [id number]     *If you need refills on other medications prior to your next appointment, please contact your pharmacy*  Follow-Up: Call back or seek an in-person evaluation if the symptoms worsen or if the condition fails to improve as anticipated.  [facility] [phone]  Other Instructions Continue with ice as needed.  Take prednisone as prescribed.  Avoid NSAIDS like Advil or Ibuprofen while taking prednisone.  You may take Tylenol.  Could use a knee compression sleeve for relief as well.  If no improvement after a few weeks follow up with PCP or orthopedics for in person evaluation and physical exam.    If you have been instructed to have an in-person evaluation today at a local Urgent Care facility, please use the link below. It will  take you to a list of all of our available [facility], including address, phone number and hours of operation. Please do not delay care.  [facility]  If you or a family member do not have a primary care provider, use the link below to schedule a visit and establish care. When you choose a [facility] primary care physician or advanced practice provider, you gain a long-term partner in health. Find a Primary Care Provider  Learn more about [facility]'s in-office and virtual care options: [facility] - Get Care Now

## 2023-03-11 DIAGNOSIS — R69 Illness, unspecified: Secondary | ICD-10-CM | POA: Diagnosis not present

## 2023-04-18 DIAGNOSIS — R69 Illness, unspecified: Secondary | ICD-10-CM | POA: Diagnosis not present

## 2023-06-11 DIAGNOSIS — R69 Illness, unspecified: Secondary | ICD-10-CM | POA: Diagnosis not present

## 2023-06-25 ENCOUNTER — Telehealth: Payer: Medicare HMO | Admitting: Emergency Medicine

## 2023-06-25 DIAGNOSIS — T1490XD Injury, unspecified, subsequent encounter: Secondary | ICD-10-CM

## 2023-06-25 NOTE — Progress Notes (Signed)
Virtual Visit Consent   Jerome Price, you are scheduled for a virtual visit with a [facility] provider today. Just as with appointments in the office, your consent must be obtained to participate. Your consent will be active for this visit and any virtual visit you may have with one of our providers in the next 365 days. If you have a MyChart account, a copy of this consent can be sent to you electronically.  As this is a virtual visit, video technology does not allow for your provider to perform a traditional examination. This may limit your provider's ability to fully assess your condition. If your provider identifies any concerns that need to be evaluated in person or the need to arrange testing (such as labs, EKG, etc.), we will make arrangements to do so. Although advances in technology are sophisticated, we cannot ensure that it will always work on either your end or our end. If the connection with a video visit is poor, the visit may have to be switched to a telephone visit. With either a video or telephone visit, we are not always able to ensure that we have a secure connection.  By engaging in this virtual visit, you consent to the provision of healthcare and authorize for your insurance to be billed (if applicable) for the services provided during this visit. Depending on your insurance coverage, you may receive a charge related to this service.  I need to obtain your verbal consent now. Are you willing to proceed with your visit today? Jerome Price has provided verbal consent on 06/25/2023 for a virtual visit (video or telephone). Cathlyn Parsons, NP  Date: 06/25/2023 11:35 AM  Virtual Visit via Video Note   I, Cathlyn Parsons, connected with  Jerome Price  ([id number], 1950-11-14) on 06/25/23 at  9:30 AM EDT by a video-enabled telemedicine application and verified that I am speaking with the correct person using two identifiers.  Location: Patient: Virtual Visit Location Patient:  Home Provider: Virtual Visit Location Provider: Home Office   I discussed the limitations of evaluation and management by telemedicine and the availability of in person appointments. The patient expressed understanding and agreed to proceed.    History of Present Illness: Jerome Price is a 72 y.o. who identifies as a male who was assigned male at birth, and is being seen today for a leg laceration. About a week ago, pt opened door to his jeep but it was not attached completely and it fell off, causing a laceration on his RLE. He has been treating it with neosporin and thoguht it was healing well until a friend pointed out yesterday that there is redness around the wound. Pt wants to make sure his home care is appropriate. Denies warmth, spreading redness, pain, or drainage/pus from the wound.   HPI: HPI  Problems:  Patient Active Problem List   Diagnosis Date Noted   History of diverticulosis 11/17/2018   Coronary artery calcification seen on CAT scan 11/30/2015   History of tobacco abuse 11/30/2015   Malignant neoplasm of upper lobe of left lung (HCC) 12/13/2014   Lung cancer (HCC) 12/10/2012    Allergies: No Known Allergies Medications: No current outpatient medications on file.  Observations/Objective: Patient is well-developed, well-nourished in no acute distress.  Resting comfortably  at home.  Head is normocephalic, atraumatic.  No labored breathing.  Speech is clear and coherent with logical content.  Patient is alert and oriented at baseline.  Pt shows me  his RLE on video. It is difficult to judge size, but it appears there is a 1.5-2cm by 3-4cm scab on his lower, lateral RLE. Minimal erythema around wound  Assessment and Plan: 1. Healing wound  Continue home care. Discussed s/sx of infection to watch for. REcommended he get a tetanus shot if not in last 5 years  Follow Up Instructions: I discussed the assessment and treatment plan with the patient. The patient was  provided an opportunity to ask questions and all were answered. The patient agreed with the plan and demonstrated an understanding of the instructions.  A copy of instructions were sent to the patient via MyChart unless otherwise noted below.   The patient was advised to call back or seek an in-person evaluation if the symptoms worsen or if the condition fails to improve as anticipated.  Time:  I spent 8 minutes with the patient via telehealth technology discussing the above problems/concerns.    Cathlyn Parsons, NP

## 2023-06-25 NOTE — Patient Instructions (Signed)
  Jerome Price, thank you for joining Cathlyn Parsons, NP for today's virtual visit.  While this provider is not your primary care provider (PCP), if your PCP is located in our provider database this encounter information will be shared with them immediately following your visit.   A [facility] MyChart account gives you access to today's visit and all your visits, tests, and labs performed at [facility] " click here if you don't have a [facility] MyChart account or go to [email or web address]  Consent: (Patient) Jerome Price provided verbal consent for this virtual visit at the beginning of the encounter.  Current Medications: No current outpatient medications on file.   Medications ordered in this encounter:  No orders of the defined types were placed in this encounter.    *If you need refills on other medications prior to your next appointment, please contact your pharmacy*  Follow-Up: Call back or seek an in-person evaluation if the symptoms worsen or if the condition fails to improve as anticipated.  [facility] [phone]  Other Instructions I recommend you get a tetanus shot. Most large pharmacies ([facility], [facility], etc) carry them.   Continue your home wound care. It looks like  your wound is healing fine. If it becomes painful again, or develops warmth or spreading redness, or becomes swollen, or drains pus, please be seen in person such as at an urgent care.    If you have been instructed to have an in-person evaluation today at a local Urgent Care facility, please use the link below. It will take you to a list of all of our available  [facility], including address, phone number and hours of operation. Please do not delay care.  [facility]  If you or a family member do not have a primary care provider, use the link below to schedule a visit and establish care. When you choose a [facility] primary care physician or  advanced practice provider, you gain a long-term partner in health. Find a Primary Care Provider  Learn more about [facility]'s in-office and virtual care options: [facility] - Get Care Now

## 2023-11-19 DIAGNOSIS — H2512 Age-related nuclear cataract, left eye: Secondary | ICD-10-CM | POA: Diagnosis not present

## 2023-11-19 DIAGNOSIS — H2511 Age-related nuclear cataract, right eye: Secondary | ICD-10-CM | POA: Diagnosis not present

## 2023-11-19 DIAGNOSIS — H53031 Strabismic amblyopia, right eye: Secondary | ICD-10-CM | POA: Diagnosis not present

## 2024-03-24 DIAGNOSIS — K080 Exfoliation of teeth due to systemic causes: Secondary | ICD-10-CM | POA: Diagnosis not present

## 2024-06-08 ENCOUNTER — Ambulatory Visit
Admission: RE | Admit: 2024-06-08 | Discharge: 2024-06-08 | Disposition: A | Discharge status: Home / Self Care | Payer: Self-pay | Source: Ambulatory Visit

## 2024-10-23 ENCOUNTER — Ambulatory Visit

## 2024-11-02 ENCOUNTER — Ambulatory Visit: Payer: Self-pay | Admitting: Family Medicine

## 2024-11-02 ENCOUNTER — Ambulatory Visit
Admission: RE | Admit: 2024-11-02 | Discharge: 2024-11-02 | Disposition: A | Discharge status: Home / Self Care | Payer: Self-pay | Source: Ambulatory Visit | Attending: Family Medicine | Admitting: Family Medicine

## 2024-11-02 ENCOUNTER — Ambulatory Visit (INDEPENDENT_AMBULATORY_CARE_PROVIDER_SITE_OTHER)

## 2024-11-02 VITALS — BP 171/76 | HR 69 | Temp 97.9°F | Resp 18

## 2024-11-02 DIAGNOSIS — M79672 Pain in left foot: Secondary | ICD-10-CM

## 2024-11-02 NOTE — ED Triage Notes (Signed)
 Pt reports L foot x1 month. Pt states area of foot just below his pinky toe has been very sore with sharp pains while walking. Denies injuries or trauma to area. Pain is only with walking, but occasionally wakes him up at night. Slight redness noted to area. Tried voltaren gel and biofreeze with no relief.   Pt also mentions injury to R ribcage area x1 week ago at the gym. Notes he fell into piece of equipment hitting the R ribcage area. Describes constant soreness to area but does feel like it is improving. Ibuprofen  taken with relief.

## 2024-11-02 NOTE — Discharge Instructions (Addendum)
 By my review there might be a bony abnormality in that joint of your foot where you are hurting.  The radiologist will also read your x-ray, and if their interpretation differs significantly from mine, and the management of your condition would change, we will call you.   I do want to see podiatrist last foot specialist, so please call the office listed in this paperwork.  Follow-up with primary care  In the short-term you can take Tylenol  as needed.  Wearing a shoe with good support and padding in the area where you are hurting can also help.  Since the skin in that area is dry, please put on some very thick lotion or petroleum jelly/Vaseline a couple of times a day.

## 2024-11-02 NOTE — ED Provider Notes (Signed)
 " [facility]    CSN: [id number] Arrival date & time: 11/02/24  1051      History   Chief Complaint Chief Complaint  Patient presents with   Foot Pain    Edge /bottom of left foot - Entered by patient   Rib Injury    HPI PRIDE Jerome Price is a 73 y.o. male.    Foot Pain  Here for pain in his left foot.  It has been bothering him since about 1 month ago.  He does not recall any trauma or fall.  It mainly hurts when he is walking on it or standing on it.  For the most part it does not hurt when he is sitting or lying down.  Occasionally will hurt him when he is first awakening in the morning.  No fever or chills.  He also does have some right lower rib cage pain.  About 10 days ago he tripped while at the gym and fell onto a piece of equipment onto his right lower rib cage.  That is when it started hurting and was hurting a good bit.  It is improving and not bothering him as much now.  Past medical history significant for lung cancer, about 17 years ago.  He does not have a primary care provider  NKDA    Past Medical History:  Diagnosis Date   Cancer (HCC) lung ca    dx'd 2008    Patient Active Problem List   Diagnosis Date Noted   History of diverticulosis 11/17/2018   Coronary artery calcification seen on CAT scan 11/30/2015   History of tobacco abuse 11/30/2015   Malignant neoplasm of upper lobe of left lung (HCC) 12/13/2014   Lung cancer (HCC) 12/10/2012    Past Surgical History:  Procedure Laterality Date   COLONOSCOPY     EYE MUSCLE SURGERY Right    LUNG REMOVAL, PARTIAL         Home Medications    Prior to Admission medications  Not on File    Family History Family History  Problem Relation Age of Onset   Lung cancer Father    Heart attack Neg Hx    Hypertension Neg Hx    Stroke Neg Hx    Colon cancer Neg Hx    Esophageal cancer Neg Hx    Pancreatic cancer Neg Hx    Rectal cancer Neg Hx    Stomach cancer Neg Hx      Social History Social History[1]   Allergies   Patient has no known allergies.   Review of Systems Review of Systems   Physical Exam Triage Vital Signs ED Triage Vitals  Encounter Vitals Group     BP 11/02/24 1144 (!) 171/76     Girls Systolic BP Percentile --      Girls Diastolic BP Percentile --      Boys Systolic BP Percentile --      Boys Diastolic BP Percentile --      Pulse Rate 11/02/24 1144 69     Resp 11/02/24 1144 18     Temp 11/02/24 1144 97.9 F (36.6 C)     Temp Source 11/02/24 1144 Oral     SpO2 11/02/24 1144 96 %     Weight --      Height --      Head Circumference --      Peak Flow --      Pain Score 11/02/24 1143 5     Pain  Loc --      Pain Education --      Exclude from Growth Chart --    No data found.  Updated Vital Signs BP (!) 171/76 (BP Location: Left Arm)   Pulse 69   Temp 97.9 F (36.6 C) (Oral)   Resp 18   SpO2 96%   Visual Acuity Right Eye Distance:   Left Eye Distance:   Bilateral Distance:    Right Eye Near:   Left Eye Near:    Bilateral Near:     Physical Exam Vitals reviewed.  Constitutional:      General: He is not in acute distress.    Appearance: He is not ill-appearing, toxic-appearing or diaphoretic.  HENT:     Mouth/Throat:     Mouth: Mucous membranes are moist.  Eyes:     Extraocular Movements: Extraocular movements intact.     Conjunctiva/sclera: Conjunctivae normal.     Pupils: Pupils are equal, round, and reactive to light.  Cardiovascular:     Rate and Rhythm: Normal rate and regular rhythm.     Heart sounds: No murmur heard. Pulmonary:     Effort: Pulmonary effort is normal.     Breath sounds: Normal breath sounds.  Chest:     Chest wall: Tenderness (right anterior axillary line lower rib cage, mild) present.  Musculoskeletal:     Cervical back: Neck supple.     Comments: There is no edema of the area were he shows me it has been hurting.  This is around the left fifth MTP.  There is no  erythema of the area that I can discern.  On the plantar surface there is a little dot of dark red or black that is about 2 mm in diameter.  I do not see any other changes that are consistent with a wart, but he does have very dry skin in the area which may make it difficult to see that.  Lymphadenopathy:     Cervical: No cervical adenopathy.  Skin:    Coloration: Skin is not jaundiced or pale.  Neurological:     General: No focal deficit present.     Mental Status: He is alert and oriented to person, place, and time.  Psychiatric:        Behavior: Behavior normal.      UC Treatments / Results  Labs (all labs ordered are listed, but only abnormal results are displayed) Labs Reviewed - No data to display  EKG   Radiology No results found.  Procedures Procedures (including critical care time)  Medications Ordered in UC Medications - No data to display  Initial Impression / Assessment and Plan / UC Course  I have reviewed the triage vital signs and the nursing notes.  Pertinent labs & imaging results that were available during my care of the patient were reviewed by me and considered in my medical decision making (see chart for details).     By my review on the AP view there is a dark area on the distal fifth MTP and on the proximal medial left fifth phalanx.  I cannot tell that there is any erosion of the bony edges.  Staff will assist him in finding a primary care provider  I have given him contact information for podiatry. Final Clinical Impressions(s) / UC Diagnoses   Final diagnoses:  Left foot pain     Discharge Instructions      By my review there might be a bony abnormality in that  joint of your foot where you are hurting.  The radiologist will also read your x-ray, and if their interpretation differs significantly from mine, and the management of your condition would change, we will call you.   I do want to see podiatrist last foot specialist, so please  call the office listed in this paperwork.  Follow-up with primary care  In the short-term you can take Tylenol  as needed.  Wearing a shoe with good support and padding in the area where you are hurting can also help.  Since the skin in that area is dry, please put on some very thick lotion or petroleum jelly/Vaseline a couple of times a day.     ED Prescriptions   None    I have reviewed the PDMP during this encounter.     [1]  Social History Tobacco Use   Smoking status: Former    Current packs/day: 0.00    Types: Cigarettes    Quit date: 11/05/1998    Years since quitting: 26.0   Smokeless tobacco: Never  Vaping Use   Vaping status: Never Used  Substance Use Topics   Alcohol use: Yes    Alcohol/week: 0.0 standard drinks of alcohol    Comment: occasional beer   Drug use: No     Vonna Sharlet POUR, MD 11/02/24 1230  "

## 2024-11-06 ENCOUNTER — Encounter: Payer: Self-pay | Admitting: Podiatry

## 2024-11-06 ENCOUNTER — Ambulatory Visit: Admitting: Podiatry

## 2024-11-06 DIAGNOSIS — S90852A Superficial foreign body, left foot, initial encounter: Secondary | ICD-10-CM

## 2024-11-06 NOTE — Progress Notes (Signed)
"  °  Subjective:  Patient ID: Jerome Price, male    DOB: 08-28-1951,   MRN: [id number]  Chief Complaint  Patient presents with   Foot Pain    My foot hurts. (Plantar 5th met left)    74 y.o. male presents for concern of left foot pain on the fifth metatarsal area that has been ongoing for about a month.  It has been worsening with time.  He was seen in urgent care and advised to follow-up here.  Has been taking Tylenol  to help with the pain.  Has been worsening.. Denies any other pedal complaints. Denies n/v/f/c.   Past Medical History:  Diagnosis Date   Cancer (HCC) lung ca    dx'd 2008    Objective:  Physical Exam: Vascular: DP/PT pulses 2/4 bilateral. CFT <3 seconds. Normal hair growth on digits. No edema.  Skin. No lacerations or abrasions bilateral feet.  Hyperkeratotic lesion with underlying hematoma present to plantar fifth metatarsal head.  Possible foreign body noted within the area.  Underlying skin intact. Musculoskeletal: MMT 5/5 bilateral lower extremities in DF, PF, Inversion and Eversion. Deceased ROM in DF of ankle joint.  Neurological: Sensation intact to light touch.   Assessment:   1. Foreign body in left foot, initial encounter      Plan:  Patient was evaluated and treated and all questions answered. Left foot foreign body -Debridement as below possible organic foreign body material present in the area. -Dressed with Neosporin, DSD. -Off-loading dancers padding -No abx indicated.  -Discussed glucose control and proper protein-rich diet.  -Discussed if any worsening redness, pain, fever or chills to call or may need to report to the emergency room. Patient expressed understanding.    Procedure: Removal of foreign body Tool: Sharp #312 chisel blade/tissue nipper Type of Debridement: Sharp removal Rationale: Painful foreign body Anesthesia: none Removed hemorrhagic bloody tissue underlying hyperkeratosis with possible organic material potentially small  hair present or other foreign body. Blood loss: Minimal (<10cc) Technique: The wound and the surrounding skin were prepped and draped in usual aseptic fashion.  Aseptic technique was maintained throughout the procedure.  Using #312 blade/tissue nipper sharp debridement of necrotic/nonviable tissue was performed until healthy skin was noted.  Possible small hair or other foreign body noted. Dressing: Dry, sterile, compression dressing. Disposition: Patient tolerated procedure well. Patient to return in 2 weeks for follow-up or as listed above.  Return if symptoms worsen or fail to improve.   [place]berry Failing, DPM    "

## 2024-12-08 ENCOUNTER — Telehealth: Admitting: General Practice

## 2024-12-08 ENCOUNTER — Encounter: Payer: Self-pay | Admitting: General Practice

## 2024-12-08 VITALS — Ht 73.0 in

## 2024-12-08 DIAGNOSIS — I2510 Atherosclerotic heart disease of native coronary artery without angina pectoris: Secondary | ICD-10-CM

## 2024-12-08 DIAGNOSIS — Z1211 Encounter for screening for malignant neoplasm of colon: Secondary | ICD-10-CM

## 2024-12-08 DIAGNOSIS — Z7689 Persons encountering health services in other specified circumstances: Secondary | ICD-10-CM | POA: Insufficient documentation

## 2024-12-08 DIAGNOSIS — Z85118 Personal history of other malignant neoplasm of bronchus and lung: Secondary | ICD-10-CM

## 2024-12-08 DIAGNOSIS — R030 Elevated blood-pressure reading, without diagnosis of hypertension: Secondary | ICD-10-CM | POA: Insufficient documentation

## 2024-12-08 NOTE — Assessment & Plan Note (Signed)
 EMR reviewed briefly.

## 2024-12-08 NOTE — Patient Instructions (Signed)
 Call and schedule physical and fasting labs.  It was a pleasure to meet you today! Please don't hesitate to contact me with any questions. Welcome to [facility]!

## 2024-12-08 NOTE — Assessment & Plan Note (Signed)
 Last ct scan was 2017.  In remission.   Does not follow with oncology.  Quit smoking in 2000.

## 2025-01-01 ENCOUNTER — Encounter: Admitting: General Practice
